# Patient Record
Sex: Female | Born: 1960 | Race: Black or African American | Hispanic: No | Marital: Single | Smoking: Never smoker
Health system: Southern US, Community
[De-identification: ages and names within clinical notes are randomized; demographics above are authoritative.]

## PROBLEM LIST (undated history)

## (undated) DIAGNOSIS — R42 Dizziness and giddiness: Secondary | ICD-10-CM

## (undated) DIAGNOSIS — D219 Benign neoplasm of connective and other soft tissue, unspecified: Secondary | ICD-10-CM

## (undated) HISTORY — DX: Benign neoplasm of connective and other soft tissue, unspecified: D21.9

## (undated) HISTORY — PX: ABDOMINAL HYSTERECTOMY: SHX81

## (undated) HISTORY — DX: Dizziness and giddiness: R42

## (undated) HISTORY — PX: CHOLECYSTECTOMY: SHX55

---
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
## 2004-03-28 ENCOUNTER — Ambulatory Visit: Payer: Self-pay

## 2004-04-26 ENCOUNTER — Ambulatory Visit: Payer: Self-pay | Admitting: Unknown Physician Specialty

## 2004-10-07 ENCOUNTER — Emergency Department: Payer: Self-pay | Admitting: Emergency Medicine

## 2005-02-27 ENCOUNTER — Ambulatory Visit (HOSPITAL_COMMUNITY): Admission: RE | Admit: 2005-02-27 | Discharge: 2005-02-27 | Payer: Self-pay | Admitting: Chiropractic Medicine

## 2006-02-26 ENCOUNTER — Ambulatory Visit: Payer: Self-pay

## 2006-03-11 HISTORY — PX: BREAST EXCISIONAL BIOPSY: SUR124

## 2006-04-17 ENCOUNTER — Ambulatory Visit: Payer: Self-pay | Admitting: Surgery

## 2006-11-26 ENCOUNTER — Ambulatory Visit: Payer: Self-pay

## 2008-06-29 ENCOUNTER — Ambulatory Visit: Payer: Self-pay

## 2008-10-18 ENCOUNTER — Emergency Department: Payer: Self-pay | Admitting: Emergency Medicine

## 2008-10-27 ENCOUNTER — Emergency Department: Payer: Self-pay | Admitting: Emergency Medicine

## 2009-06-16 ENCOUNTER — Emergency Department: Payer: Self-pay | Admitting: Unknown Physician Specialty

## 2009-07-30 ENCOUNTER — Emergency Department (HOSPITAL_COMMUNITY): Admission: EM | Admit: 2009-07-30 | Discharge: 2009-07-30 | Payer: Self-pay | Admitting: Emergency Medicine

## 2009-09-06 ENCOUNTER — Ambulatory Visit: Payer: Self-pay

## 2009-09-13 ENCOUNTER — Emergency Department: Payer: Self-pay | Admitting: Emergency Medicine

## 2010-01-12 ENCOUNTER — Emergency Department (HOSPITAL_COMMUNITY): Admission: EM | Admit: 2010-01-12 | Discharge: 2010-01-12 | Payer: Self-pay | Admitting: Emergency Medicine

## 2010-12-23 ENCOUNTER — Emergency Department: Payer: Self-pay | Admitting: Emergency Medicine

## 2011-01-15 ENCOUNTER — Emergency Department: Payer: Self-pay | Admitting: Unknown Physician Specialty

## 2011-02-26 ENCOUNTER — Ambulatory Visit: Payer: Self-pay

## 2011-08-15 ENCOUNTER — Emergency Department: Payer: Self-pay | Admitting: Emergency Medicine

## 2012-04-04 ENCOUNTER — Emergency Department: Payer: Self-pay | Admitting: Emergency Medicine

## 2012-04-04 LAB — CBC WITH DIFFERENTIAL/PLATELET
Basophil #: 0 10*3/uL (ref 0.0–0.1)
Basophil %: 0.8 %
Eosinophil #: 0.3 10*3/uL (ref 0.0–0.7)
HGB: 12.8 g/dL (ref 12.0–16.0)
Lymphocyte #: 2 10*3/uL (ref 1.0–3.6)
Lymphocyte %: 34.8 %
MCHC: 34.3 g/dL (ref 32.0–36.0)
Monocyte #: 0.7 x10 3/mm (ref 0.2–0.9)
Monocyte %: 11.5 %
Neutrophil %: 47.8 %
Platelet: 204 10*3/uL (ref 150–440)
RBC: 4.03 10*6/uL (ref 3.80–5.20)

## 2012-04-04 LAB — BASIC METABOLIC PANEL
Anion Gap: 7 (ref 7–16)
BUN: 10 mg/dL (ref 7–18)
Calcium, Total: 8.5 mg/dL (ref 8.5–10.1)
Co2: 26 mmol/L (ref 21–32)
Creatinine: 0.89 mg/dL (ref 0.60–1.30)
EGFR (Non-African Amer.): >60
Osmolality: 281 (ref 275–301)

## 2012-04-06 ENCOUNTER — Emergency Department: Payer: Self-pay | Admitting: Emergency Medicine

## 2012-04-10 LAB — CULTURE, BLOOD (SINGLE)

## 2012-12-09 ENCOUNTER — Ambulatory Visit: Payer: Self-pay

## 2013-10-20 ENCOUNTER — Ambulatory Visit: Payer: Self-pay

## 2013-10-27 ENCOUNTER — Ambulatory Visit: Payer: Self-pay

## 2013-12-22 ENCOUNTER — Ambulatory Visit: Payer: Self-pay

## 2014-01-10 ENCOUNTER — Encounter: Payer: Self-pay | Admitting: General Surgery

## 2014-01-10 ENCOUNTER — Ambulatory Visit (INDEPENDENT_AMBULATORY_CARE_PROVIDER_SITE_OTHER): Payer: PRIVATE HEALTH INSURANCE | Admitting: General Surgery

## 2014-01-10 VITALS — BP 102/60 | HR 71 | Resp 14 | Ht 60.0 in | Wt 129.0 lb

## 2014-01-10 DIAGNOSIS — N644 Mastodynia: Secondary | ICD-10-CM

## 2014-01-10 DIAGNOSIS — Z1239 Encounter for other screening for malignant neoplasm of breast: Secondary | ICD-10-CM

## 2014-01-10 NOTE — Patient Instructions (Signed)
Continue self breast exams. Call office for any new breast issues or concerns. 

## 2014-01-10 NOTE — Progress Notes (Signed)
Patient ID: Heather Ayers, female   DOB: [birth year], 53 y.o.   MRN: [id number]  Chief Complaint  Patient presents with  . Other    left breast lump    HPI Heather Ayers is a 53 y.o. female who presents for a breast evaluation. The most recent mammogram was done on 10/27/13. Patient does perform regular self breast checks and gets regular mammograms done. She reports discomfort in the left breast ever since her lumpectomy about 2-3 years ago. She reports this as coming and going. She reports first noticing a lump in her left breast about 1 year ago and it seems to be more difficult for her to find now. She does report discomfort in the area of the lump. Patient seen here in 2008.  Patient saw Dr. Pat Patrick as she did not want biopsy as office procedure.   HPI  No past medical history on file.  Past Surgical History  Procedure Laterality Date  . Cholecystectomy    . Breast lumpectomy Left 2008    Dr. Pat Patrick, nonproliferative breast tissue left UOQ.   . Breast lumpectomy Right 53 years old    No family history on file.  Social History History  Substance Use Topics  . Smoking status: Never Smoker   . Smokeless tobacco: Never Used  . Alcohol Use: 0.0 oz/week    0 Not specified per week    Allergies  Allergen Reactions  . Codeine Rash    No current outpatient prescriptions on file.   No current facility-administered medications for this visit.    Review of Systems Review of Systems  Constitutional: Negative.   Respiratory: Negative.   Cardiovascular: Negative.     Blood pressure 102/60, pulse 71, resp. rate 14, height 5' (1.524 m), weight 129 lb (58.514 kg).  Physical Exam Physical Exam  Constitutional: She is oriented to person, place, and time. She appears well-developed and well-nourished.  Eyes: Conjunctivae are normal. No scleral icterus.  Neck: Neck supple.  Cardiovascular: Normal rate and normal heart sounds.   Pulmonary/Chest: Effort normal and breath sounds  normal. Right breast exhibits inverted nipple. Right breast exhibits no mass, no nipple discharge, no skin change and no tenderness. Left breast exhibits inverted nipple. Left breast exhibits no mass, no nipple discharge, no skin change and no tenderness.  Well healed scar in left lower outer quadrant of left breast. Inverted nipples bilaterally. Well healed incision near areola at 10 o'clock right breast.  Lymphadenopathy:    She has no cervical adenopathy.  Neurological: She is alert and oriented to person, place, and time.    Data Reviewed August 2015 mammograms and ultrasound.  Assessment    Benign breast exam.  Modest mastalgia post biopsy.     Plan    Annual exams through the [facility] center.     Ref: Tanya Nones No PCP   Robert Bellow 01/11/2014, 9:06 PM

## 2014-01-11 ENCOUNTER — Encounter: Payer: Self-pay | Admitting: General Surgery

## 2014-01-11 DIAGNOSIS — N644 Mastodynia: Secondary | ICD-10-CM | POA: Insufficient documentation

## 2014-04-07 ENCOUNTER — Emergency Department: Payer: Self-pay | Admitting: Student

## 2014-05-20 ENCOUNTER — Emergency Department: Payer: Self-pay | Admitting: Emergency Medicine

## 2014-06-06 ENCOUNTER — Emergency Department (HOSPITAL_COMMUNITY): Payer: Self-pay

## 2014-06-06 ENCOUNTER — Emergency Department (HOSPITAL_COMMUNITY)
Admission: EM | Admit: 2014-06-06 | Discharge: 2014-06-06 | Disposition: A | Discharge status: Home / Self Care | Payer: Self-pay | Attending: Emergency Medicine | Admitting: Emergency Medicine

## 2014-06-06 ENCOUNTER — Encounter (HOSPITAL_COMMUNITY): Payer: Self-pay | Admitting: Neurology

## 2014-06-06 DIAGNOSIS — M545 Low back pain, unspecified: Secondary | ICD-10-CM

## 2014-06-06 DIAGNOSIS — Z9049 Acquired absence of other specified parts of digestive tract: Secondary | ICD-10-CM | POA: Insufficient documentation

## 2014-06-06 DIAGNOSIS — R112 Nausea with vomiting, unspecified: Secondary | ICD-10-CM | POA: Insufficient documentation

## 2014-06-06 DIAGNOSIS — R42 Dizziness and giddiness: Secondary | ICD-10-CM | POA: Insufficient documentation

## 2014-06-06 DIAGNOSIS — G44321 Chronic post-traumatic headache, intractable: Secondary | ICD-10-CM | POA: Insufficient documentation

## 2014-06-06 DIAGNOSIS — Z87828 Personal history of other (healed) physical injury and trauma: Secondary | ICD-10-CM | POA: Insufficient documentation

## 2014-06-06 LAB — COMPREHENSIVE METABOLIC PANEL
ALBUMIN: 4.3 g/dL (ref 3.5–5.2)
ALK PHOS: 42 U/L (ref 39–117)
ALT: 19 U/L (ref 0–35)
AST: 22 U/L (ref 0–37)
Anion gap: 9 (ref 5–15)
BILIRUBIN TOTAL: 0.8 mg/dL (ref 0.3–1.2)
BUN: 15 mg/dL (ref 6–23)
CHLORIDE: 103 mmol/L (ref 96–112)
CO2: 27 mmol/L (ref 19–32)
Calcium: 9.7 mg/dL (ref 8.4–10.5)
Creatinine, Ser: 0.95 mg/dL (ref 0.50–1.10)
GFR calc Af Amer: 78 mL/min — ABNORMAL LOW (ref >90)
GFR, EST NON AFRICAN AMERICAN: 68 mL/min — AB (ref >90)
Glucose, Bld: 92 mg/dL (ref 70–99)
POTASSIUM: 4.3 mmol/L (ref 3.5–5.1)
SODIUM: 139 mmol/L (ref 135–145)
Total Protein: 7.3 g/dL (ref 6.0–8.3)

## 2014-06-06 LAB — CBC WITH DIFFERENTIAL/PLATELET
BASOS ABS: 0.1 10*3/uL (ref 0.0–0.1)
BASOS PCT: 1 % (ref 0–1)
EOS ABS: 0.3 10*3/uL (ref 0.0–0.7)
EOS PCT: 7 % — AB (ref 0–5)
HCT: 41 % (ref 36.0–46.0)
HEMOGLOBIN: 13.7 g/dL (ref 12.0–15.0)
LYMPHS ABS: 2.5 10*3/uL (ref 0.7–4.0)
Lymphocytes Relative: 59 % — ABNORMAL HIGH (ref 12–46)
MCH: 30.6 pg (ref 26.0–34.0)
MCHC: 33.4 g/dL (ref 30.0–36.0)
MCV: 91.5 fL (ref 78.0–100.0)
MONO ABS: 0.2 10*3/uL (ref 0.1–1.0)
MONOS PCT: 5 % (ref 3–12)
NEUTROS PCT: 28 % — AB (ref 43–77)
Neutro Abs: 1.2 10*3/uL — ABNORMAL LOW (ref 1.7–7.7)
Platelets: 251 10*3/uL (ref 150–400)
RBC: 4.48 MIL/uL (ref 3.87–5.11)
RDW: 12.1 % (ref 11.5–15.5)
WBC: 4.2 10*3/uL (ref 4.0–10.5)

## 2014-06-06 MED ORDER — ONDANSETRON 4 MG PO TBDP: ORAL_TABLET | ORAL | Status: DC

## 2014-06-06 NOTE — ED Notes (Signed)
Pt reports in feb she slipped on some water. Since then has been having headaches, vomiting initially and feels nauseated. C/o left shoulder pain since then and lower back pain. Reports she went to [place] and was dx with vertigo. Also pain under butt when she is sleeping. Pt is a x 4. In NAD

## 2014-06-06 NOTE — ED Notes (Signed)
Pt walked with family to restroom. Did well with stand by assistance

## 2014-06-06 NOTE — ED Provider Notes (Addendum)
CSN: [id number]     Arrival date & time 06/06/14  1245 History   First MD Initiated Contact with Patient 06/06/14 1502     Chief Complaint  Patient presents with  . Fall     (Consider location/radiation/quality/duration/timing/severity/associated sxs/prior Treatment) Patient is a 54 y.o. female presenting with fall. The history is provided by the patient.  Fall Associated symptoms include headaches. Pertinent negatives include no chest pain, no abdominal pain and no shortness of breath.   patient with an injury on the job on February 1 slipped on a wet floor from a busted pipe. Falling backwards. Patient started with low back pain and 2 days later started with headache and vertigo. Patient followed at urgent care related to her workman's comp case and also at [facility] had head CT x-rays of her back which were all negative. Patient started on meclizine and when taking that does feel better has run out. Patient's lawyer is trying to make arrangements for her to be seen by neurology and did have an outpatient MRI. Patient has had the persistent vertigo though did not start immediately after the fall and may not be related to the injury at all. Patient does have persistent headache. Anywhere from 6-8 out of 10. Patient does not have any of lower extremity focal deficits. No numbness no weakness. Does have persistent bilateral low back pain. And as mentioned x-ray of the low back at [facility] was reported to be negative.  History reviewed. No pertinent past medical history. Past Surgical History  Procedure Laterality Date  . Cholecystectomy    . Breast lumpectomy Left 2008    Dr. Pat Patrick, nonproliferative breast tissue left UOQ.   . Breast lumpectomy Right 54 years old   No family history on file. History  Substance Use Topics  . Smoking status: Never Smoker   . Smokeless tobacco: Never Used  . Alcohol Use: 0.0 oz/week    0 Standard drinks or equivalent per week   OB History    Gravida Para  Term Preterm AB TAB SAB Ectopic Multiple Living   1         1      Obstetric Comments   1st Menstrual Cycle:  13 1st Pregnancy:  19     Review of Systems  Constitutional: Negative for fever.  HENT: Negative for congestion.   Eyes: Negative for visual disturbance.  Respiratory: Negative for shortness of breath.   Cardiovascular: Negative for chest pain.  Gastrointestinal: Positive for nausea and vomiting. Negative for abdominal pain.  Musculoskeletal: Positive for back pain.  Skin: Negative for rash.  Neurological: Positive for dizziness, light-headedness and headaches. Negative for weakness and numbness.  Hematological: Does not bruise/bleed easily.      Allergies  Codeine  Home Medications   Prior to Admission medications   Medication Sig Start Date End Date Taking? Authorizing Provider  butalbital-acetaminophen-caffeine (FIORICET, ESGIC) 50-325-40 MG per tablet Take 1 tablet by mouth every 4 (four) hours as needed for headache.   Yes Historical Provider, MD  ibuprofen (ADVIL,MOTRIN) 200 MG tablet Take 200 mg by mouth every 6 (six) hours as needed for moderate pain.   Yes Historical Provider, MD  meclizine (ANTIVERT) 25 MG tablet Take 25 mg by mouth 3 (three) times daily as needed for dizziness.   Yes Historical Provider, MD   BP 114/98 mmHg  Pulse 67  Temp(Src) 98.1 F (36.7 C) (Oral)  Resp 16  SpO2 100% Physical Exam  Constitutional: She is oriented to person, place,  and time. She appears well-developed and well-nourished. No distress.  HENT:  Head: Normocephalic and atraumatic.  Mouth/Throat: Oropharynx is clear and moist.  Eyes: Conjunctivae and EOM are normal. Pupils are equal, round, and reactive to light.  Neck: Normal range of motion.  Cardiovascular: Normal rate and normal heart sounds.   No murmur heard. Pulmonary/Chest: Effort normal and breath sounds normal.  Abdominal: Soft. Bowel sounds are normal. There is no tenderness.  Musculoskeletal: Normal  range of motion. She exhibits no edema or tenderness.  Except for some mild tenderness to the lower back bilaterally. No muscle spasm. Lower extremities without any focal deficit.  Neurological: She is alert and oriented to person, place, and time. No cranial nerve deficit. She exhibits normal muscle tone. Coordination normal.  Skin: Skin is warm. No rash noted.  Nursing note and vitals reviewed.   ED Course  Procedures (including critical care time) Labs Review Labs Reviewed  CBC WITH DIFFERENTIAL/PLATELET - Abnormal; Notable for the following:    Neutrophils Relative % 28 (*)    Neutro Abs 1.2 (*)    Lymphocytes Relative 59 (*)    Eosinophils Relative 7 (*)    All other components within normal limits  COMPREHENSIVE METABOLIC PANEL - Abnormal; Notable for the following:    GFR calc non Af Amer 68 (*)    GFR calc Af Amer 78 (*)    All other components within normal limits   Results for orders placed or performed during the hospital encounter of 06/06/14  CBC with Differential  Result Value Ref Range   WBC 4.2 4.0 - 10.5 K/uL   RBC 4.48 3.87 - 5.11 MIL/uL   Hemoglobin 13.7 12.0 - 15.0 g/dL   HCT 41.0 36.0 - 46.0 %   MCV 91.5 78.0 - 100.0 fL   MCH 30.6 26.0 - 34.0 pg   MCHC 33.4 30.0 - 36.0 g/dL   RDW 12.1 11.5 - 15.5 %   Platelets 251 150 - 400 K/uL   Neutrophils Relative % 28 (L) 43 - 77 %   Neutro Abs 1.2 (L) 1.7 - 7.7 K/uL   Lymphocytes Relative 59 (H) 12 - 46 %   Lymphs Abs 2.5 0.7 - 4.0 K/uL   Monocytes Relative 5 3 - 12 %   Monocytes Absolute 0.2 0.1 - 1.0 K/uL   Eosinophils Relative 7 (H) 0 - 5 %   Eosinophils Absolute 0.3 0.0 - 0.7 K/uL   Basophils Relative 1 0 - 1 %   Basophils Absolute 0.1 0.0 - 0.1 K/uL  Comprehensive metabolic panel  Result Value Ref Range   Sodium 139 135 - 145 mmol/L   Potassium 4.3 3.5 - 5.1 mmol/L   Chloride 103 96 - 112 mmol/L   CO2 27 19 - 32 mmol/L   Glucose, Bld 92 70 - 99 mg/dL   BUN 15 6 - 23 mg/dL   Creatinine, Ser 0.95 0.50  - 1.10 mg/dL   Calcium 9.7 8.4 - 10.5 mg/dL   Total Protein 7.3 6.0 - 8.3 g/dL   Albumin 4.3 3.5 - 5.2 g/dL   AST 22 0 - 37 U/L   ALT 19 0 - 35 U/L   Alkaline Phosphatase 42 39 - 117 U/L   Total Bilirubin 0.8 0.3 - 1.2 mg/dL   GFR calc non Af Amer 68 (L) >90 mL/min   GFR calc Af Amer 78 (L) >90 mL/min   Anion gap 9 5 - 15    Imaging Review No results found.  EKG Interpretation None      MDM   Final diagnoses:  Vertigo  Bilateral low back pain without sciatica  Intractable chronic post-traumatic headache    Patient currently involved in a Workmen's Comp. case. Patient with fall at work on February 1. Fell backwards slipped on a wet floor from a pipe burst. Patient with injury to her low back and has had persistent headaches and vertigo since. Symptoms didn't really start for about 2 days after the fall.  Patient is already had a head CT which was normal and x-rays of her back done at [facility] and by urgent care in the local area.  Patients lawyers trying to get her in to see a neurologist.  MRI of brain done here today for persistent vertigo to make sure there is no temporal lobe tumor. Otherwise MRI of the brain and low back could be done as an outpatient. Patient is now had vertigo for almost 2 months. Improve some with antivertigo but does not resolve. Patient also with persistent headache.  If MRI is negative. Patient can be discharged home with antivert.    Fredia Sorrow, MD 06/06/14 [place], MD 06/06/14 [phone]

## 2014-06-06 NOTE — ED Notes (Signed)
Pt went to MRI.

## 2014-06-06 NOTE — ED Notes (Signed)
MD at bedside. 

## 2014-06-06 NOTE — Discharge Instructions (Signed)

## 2014-07-01 ENCOUNTER — Ambulatory Visit
Admission: RE | Admit: 2014-07-01 | Discharge: 2014-07-01 | Disposition: A | Discharge status: Home / Self Care | Payer: PRIVATE HEALTH INSURANCE | Source: Ambulatory Visit | Attending: Neurosurgery | Admitting: Neurosurgery

## 2014-07-01 ENCOUNTER — Other Ambulatory Visit: Payer: Self-pay | Admitting: Neurosurgery

## 2014-07-01 DIAGNOSIS — M5412 Radiculopathy, cervical region: Secondary | ICD-10-CM

## 2015-04-20 ENCOUNTER — Encounter: Payer: Self-pay | Admitting: Emergency Medicine

## 2015-04-20 ENCOUNTER — Emergency Department: Payer: Self-pay

## 2015-04-20 ENCOUNTER — Emergency Department
Admission: EM | Admit: 2015-04-20 | Discharge: 2015-04-20 | Disposition: A | Discharge status: Home / Self Care | Payer: Self-pay | Attending: Emergency Medicine | Admitting: Emergency Medicine

## 2015-04-20 DIAGNOSIS — Y9389 Activity, other specified: Secondary | ICD-10-CM | POA: Insufficient documentation

## 2015-04-20 DIAGNOSIS — S3992XA Unspecified injury of lower back, initial encounter: Secondary | ICD-10-CM | POA: Insufficient documentation

## 2015-04-20 DIAGNOSIS — Y998 Other external cause status: Secondary | ICD-10-CM | POA: Insufficient documentation

## 2015-04-20 DIAGNOSIS — Y92512 Supermarket, store or market as the place of occurrence of the external cause: Secondary | ICD-10-CM | POA: Insufficient documentation

## 2015-04-20 DIAGNOSIS — S199XXA Unspecified injury of neck, initial encounter: Secondary | ICD-10-CM | POA: Insufficient documentation

## 2015-04-20 DIAGNOSIS — Z79899 Other long term (current) drug therapy: Secondary | ICD-10-CM | POA: Insufficient documentation

## 2015-04-20 DIAGNOSIS — W208XXA Other cause of strike by thrown, projected or falling object, initial encounter: Secondary | ICD-10-CM | POA: Insufficient documentation

## 2015-04-20 DIAGNOSIS — S0083XA Contusion of other part of head, initial encounter: Secondary | ICD-10-CM | POA: Insufficient documentation

## 2015-04-20 MED ORDER — ONDANSETRON 8 MG PO TBDP
8.0000 mg | ORAL_TABLET | Freq: Once | ORAL | Status: AC
  Administered 2015-04-20: 8 mg via ORAL
  Filled 2015-04-20: qty 1

## 2015-04-20 MED ORDER — NAPROXEN 500 MG PO TABS
500.0000 mg | ORAL_TABLET | Freq: Once | ORAL | Status: AC
  Administered 2015-04-20: 500 mg via ORAL
  Filled 2015-04-20: qty 1

## 2015-04-20 MED ORDER — NAPROXEN 500 MG PO TABS: 500.0000 mg | ORAL_TABLET | Freq: Two times a day (BID) | ORAL | Status: DC

## 2015-04-20 NOTE — ED Provider Notes (Signed)
[facility] Emergency Department Provider Note  ____________________________________________  Time seen: Approximately 6:52 PM  I have reviewed the triage vital signs and the nursing notes.   HISTORY  Chief Complaint Eye Pain and Neck Pain    HPI Heather Ayers is a 55 y.o. female who presents emergency department complaining of left face and eye pain, headache, nausea, and pain radiating down her neck, back, and legs. Patient states that she was at a restaurant getting a "hot dog" when she reached for a napkin for the dispenser. She states that the dispenser fell off the wall striking her in the left side of her face. 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 Patient did not lose consciousness at any time. Patient denies any numbness or tingling to the legs or arms. She denies any chest pain, shortness of breath, or vomiting.   History reviewed. No pertinent past medical history.  Patient Active Problem List   Diagnosis Date Noted  . Mastalgia 01/11/2014    Past Surgical History  Procedure Laterality Date  . Cholecystectomy    . Breast lumpectomy Left 2008    Dr. Pat Patrick, nonproliferative breast tissue left UOQ.   . Breast lumpectomy Right 55 years old    Current Outpatient Rx  Name  Route  Sig  Dispense  Refill  . butalbital-acetaminophen-caffeine (FIORICET, ESGIC) 50-325-40 MG per tablet   Oral   Take 1 tablet by mouth every 4 (four) hours as needed for headache.         . ibuprofen (ADVIL,MOTRIN) 200 MG tablet   Oral   Take 200 mg by mouth every 6 (six) hours as needed for moderate pain.         . meclizine (ANTIVERT) 25 MG tablet   Oral   Take 25 mg by mouth 3 (three) times daily as needed for dizziness.         . naproxen (NAPROSYN) 500 MG tablet   Oral   Take 1 tablet (500 mg total) by mouth 2 (two) times daily with a meal.   60 tablet   0   . ondansetron  (ZOFRAN ODT) 4 MG disintegrating tablet      4mg  ODT q4 hours prn nausea/vomit   10 tablet   0     Allergies Codeine  No family history on file.  Social History Social History  Substance Use Topics  . Smoking status: Never Smoker   . Smokeless tobacco: Never Used  . Alcohol Use: 0.0 oz/week    0 Standard drinks or equivalent per week     Review of Systems  Constitutional: No fever/chills Eyes: No visual changes. No discharge Cardiovascular: no chest pain. Respiratory: no cough. No SOB. Gastrointestinal: No abdominal pain.  Positive for nausea but no vomiting.   Musculoskeletal: Positive for neck and back pain. Skin: Negative for rash. Neurological: Positive for headache but denies focal weakness or numbness. 10-point ROS otherwise negative.  ____________________________________________   PHYSICAL EXAM:  VITAL SIGNS: ED Triage Vitals  Enc Vitals Group     BP 04/20/15 1818 146/99 mmHg     Pulse Rate 04/20/15 1818 59     Resp 04/20/15 1818 16     Temp 04/20/15 1818 98.1 F (36.7 C)     Temp Source 04/20/15 1818 Oral     SpO2 04/20/15 1818 100 %     Weight 04/20/15 1818 135 lb (61.236 kg)  Height 04/20/15 1818 5' (1.524 m)     Head Cir --      Peak Flow --      Pain Score 04/20/15 1818 10     Pain Loc --      Pain Edu? --      Excl. in [place]? --      Constitutional: Alert and oriented. Well appearing and in no acute distress. Eyes: Conjunctivae are normal. PERRL. EOMI. funduscopic exam is unremarkable bilaterally. Radial reflexes appreciated. No hyphema is noted. Head: Atraumatic. No ecchymosis, contusion, abrasions are noted. There is no palpable abnormality to the bony structures of the face or head. No crepitus noted. Patient reports diffuse tenderness to palpation around the left orbit. Neck: No stridor.  No cervical spine tenderness to palpation. Hematological/Lymphatic/Immunilogical: No cervical lymphadenopathy. Cardiovascular: Normal rate, regular  rhythm. Normal S1 and S2.  Good peripheral circulation. Respiratory: Normal respiratory effort without tachypnea or retractions. Lungs CTAB. Musculoskeletal: No lower extremity tenderness nor edema.  No joint effusions. No visible deformity to spine upon inspection. No tenderness to palpation. No palpable abnormality. Negative straight leg raise bilaterally. Radial pulses appreciated bilaterally. Dorsalis pedis pulse operation bilaterally. Sensation intact and equal all 4 extremities. DTRs intact. Neurologic:  Normal speech and language. No gross focal neurologic deficits are appreciated. Cranial nerves II through XII are grossly intact. Skin:  Skin is warm, dry and intact. No rash noted. Psychiatric: Mood and affect are normal. Speech and behavior are normal. Patient exhibits appropriate insight and judgement.   ____________________________________________   LABS (all labs ordered are listed, but only abnormal results are displayed)  Labs Reviewed - No data to display ____________________________________________  EKG   ____________________________________________  RADIOLOGY Diamantina Providence Cuthriell, personally viewed and evaluated these images as part of my medical decision making, as well as reviewing the written report by the radiologist.  Ct Maxillofacial Wo Cm  04/20/2015  CLINICAL DATA:  55 year old female with history and left orbital pain. EXAM: CT MAXILLOFACIAL WITHOUT CONTRAST TECHNIQUE: Multidetector CT imaging of the maxillofacial structures was performed. Multiplanar CT image reconstructions were also generated. A small metallic BB was placed on the right temple in order to reliably differentiate right from left. COMPARISON:  Head CT dated 05/20/2014 FINDINGS: There is no acute fracture. The maxilla, mandible, and pterygoid plates are intact. The globes, retro-orbital fat, and orbital walls are preserved. There is diffuse mucoperiosteal thickening of paranasal sinuses with partial  opacification of the ethmoid air cells and maxillary sinuses. No air-fluid level. The visualized mastoid air cells are clear. No significant soft tissue swelling. IMPRESSION: No acute facial fractures. Electronically Signed   By: Anner Crete M.D.   On: 04/20/2015 19:10    ____________________________________________    PROCEDURES  Procedure(s) performed:       Medications  naproxen (NAPROSYN) tablet 500 mg (500 mg Oral Given 04/20/15 1908)  ondansetron (ZOFRAN-ODT) disintegrating tablet 8 mg (8 mg Oral Given 04/20/15 1908)     ____________________________________________   INITIAL IMPRESSION / ASSESSMENT AND PLAN / ED COURSE  Pertinent labs & imaging results that were available during my care of the patient were reviewed by me and considered in my medical decision making (see chart for details).  Patient's diagnosis is consistent with facial contusion. CT scan was ordered and reveals no acute abnormality. Patient has no neuro deficits or other focal abnormality. Headache responded well to Naprosyn.. Patient will be discharged home with prescriptions for anti-inflammatories. Patient is to follow up with primary care provider if  symptoms persist past this treatment course. Patient is given ED precautions to return to the ED for any worsening or new symptoms.     ____________________________________________  FINAL CLINICAL IMPRESSION(S) / ED DIAGNOSES  Final diagnoses:  Facial contusion, initial encounter      NEW MEDICATIONS STARTED DURING THIS VISIT:  New Prescriptions   NAPROXEN (NAPROSYN) 500 MG TABLET    Take 1 tablet (500 mg total) by mouth 2 (two) times daily with a meal.        Darletta Moll, PA-C 04/20/15 [place], MD 04/21/15 [phone]

## 2015-04-20 NOTE — ED Notes (Addendum)
Pt reports got hit in the forehead with a paper towel dispenser at a store; reports pain behind left eye and pain shooting down back. Pt with hx of bulging disc. Pt reports headache and nausea.

## 2015-04-20 NOTE — Discharge Instructions (Signed)

## 2015-04-24 ENCOUNTER — Ambulatory Visit: Payer: Self-pay | Attending: Oncology | Admitting: *Deleted

## 2015-04-24 ENCOUNTER — Encounter: Payer: Self-pay | Admitting: *Deleted

## 2015-04-24 VITALS — BP 120/84 | HR 60 | Temp 95.7°F | Resp 18 | Ht 63.39 in | Wt 146.5 lb

## 2015-04-24 DIAGNOSIS — N63 Unspecified lump in unspecified breast: Secondary | ICD-10-CM

## 2015-04-24 NOTE — Progress Notes (Signed)
Subjective:     Patient ID: Heather Ayers, female   DOB: 05-21-1960, 55 y.o.   MRN: [id number]  HPI   Review of Systems     Objective:   Physical Exam  Pulmonary/Chest: Right breast exhibits inverted nipple. Right breast exhibits no mass, no nipple discharge, no skin change and no tenderness. Left breast exhibits inverted nipple. Left breast exhibits no mass, no nipple discharge, no skin change and no tenderness. Breasts are symmetrical.    Bilateral nipples are inverted       Assessment:     55 year old Black female returns to [facility] for annual screening.  On clinical breast exam I can palpate a tender 2 cm smooth, mobile nodule at approximately 2:30 left breast.  This is the same area of concern that was noted in August 2015.  Diagnostic mammogram and ultrasound at that time was a birads 2 with glandular tissue at the site of concern. Patient also saw Dr. Bary Castilla and yearly screening was recommended. Patient has one sister deceased from breast cancer.  Taught self breast awareness.  Patient has been screened for eligibility.  She does not have any insurance, Medicare or Medicaid.  She also meets financial eligibility.  Hand-out given on the Affordable Care Act.    Plan:     Will go ahead and get bilateral diagnostic mammogram and ultrasound since it has been about a year and a half since her last exam, and her family history of breast cancer.  Will follow-up per [facility] protocol.

## 2015-04-24 NOTE — Patient Instructions (Signed)
Gave patient hand-out, Women Staying Healthy, Active and Well from [facility], with education on breast health, pap smears, heart and colon health. 

## 2015-05-05 ENCOUNTER — Ambulatory Visit
Admission: RE | Admit: 2015-05-05 | Discharge: 2015-05-05 | Disposition: A | Discharge status: Home / Self Care | Payer: Self-pay | Source: Ambulatory Visit | Attending: Oncology | Admitting: Oncology

## 2015-05-05 ENCOUNTER — Encounter: Payer: Self-pay | Admitting: *Deleted

## 2015-05-05 DIAGNOSIS — N63 Unspecified lump in unspecified breast: Secondary | ICD-10-CM

## 2015-05-05 NOTE — Progress Notes (Signed)
Letter mailed from [facility] to inform patient of her normal mammogram results.  Patient is to follow-up with annual screening in one year.  HSIS to Christy. 

## 2016-10-02 ENCOUNTER — Encounter: Payer: Self-pay | Admitting: *Deleted

## 2016-10-02 ENCOUNTER — Ambulatory Visit: Payer: Self-pay | Attending: Oncology | Admitting: *Deleted

## 2016-10-02 ENCOUNTER — Ambulatory Visit
Admission: RE | Admit: 2016-10-02 | Discharge: 2016-10-02 | Disposition: A | Discharge status: Home / Self Care | Payer: Self-pay | Source: Ambulatory Visit | Attending: Oncology | Admitting: Oncology

## 2016-10-02 VITALS — BP 117/79 | HR 58 | Temp 98.0°F | Ht 60.0 in | Wt 130.0 lb

## 2016-10-02 DIAGNOSIS — Z0000 Encounter for general adult medical examination without abnormal findings: Secondary | ICD-10-CM

## 2016-10-02 NOTE — Progress Notes (Signed)
Subjective:     Patient ID: Heather Ayers, female   DOB: 1960/09/23, 56 y.o.   MRN: [id number]  HPI   Review of Systems     Objective:   Physical Exam  Pulmonary/Chest: Right breast exhibits inverted nipple. Right breast exhibits no mass, no nipple discharge, no skin change and no tenderness. Left breast exhibits inverted nipple. Left breast exhibits no mass, no nipple discharge, no skin change and no tenderness. Breasts are symmetrical.  Bilateral inverted nipples - patient states this is normal for her.       Assessment:     56 year old Black female returns to [facility] for annual exam.  Clinical breast exam unremarkable.  Taught self breast awareness.  Patient had a supracervical hysterectomy in 2006 for fibroids.  Last pap was in 2012.  Patient refused pap smear today.  Explained guidelines for pap smear with a supracervical hysterectomy.  Encouraged patient to follow pap smear quidelines.  States she will return a different day for her pap.  Patient has been screened for eligibility.  She does not have any insurance, Medicare or Medicaid.  She also meets financial eligibility.  Hand-out given on the Affordable Care Act.    Plan:     Screening mammogram ordered.  Will follow-up per [facility] protocol.  Jeanella Anton to schedule patient to return for her pap smear.

## 2016-10-02 NOTE — Patient Instructions (Signed)
Gave patient hand-out, Women Staying Healthy, Active and Well from [facility], with education on breast health, pap smears, heart and colon health. 

## 2016-10-03 ENCOUNTER — Encounter: Payer: Self-pay | Admitting: *Deleted

## 2016-10-03 NOTE — Progress Notes (Signed)
Letter mailed from [facility] to inform patient of her normal mammogram results.  Patient is to follow-up with annual screening in one year.  HSIS to Christy. 

## 2016-10-30 ENCOUNTER — Ambulatory Visit: Payer: Self-pay | Attending: Oncology | Admitting: *Deleted

## 2016-10-30 ENCOUNTER — Encounter: Payer: Self-pay | Admitting: *Deleted

## 2016-10-30 VITALS — BP 109/73 | HR 53 | Temp 98.3°F | Resp 18

## 2016-10-30 DIAGNOSIS — Z0000 Encounter for general adult medical examination without abnormal findings: Secondary | ICD-10-CM

## 2016-10-30 NOTE — Progress Notes (Signed)
Subjective:     Patient ID: Heather Ayers, female   DOB: 09-14-1960, 56 y.o.   MRN: [id number]  HPI   Review of Systems     Objective:   Physical Exam  Genitourinary: No labial fusion. There is no rash, tenderness, lesion or injury on the right labia. There is no rash, tenderness, lesion or injury on the left labia. Cervix exhibits no motion tenderness, no discharge and no friability. Right adnexum displays no mass, no tenderness and no fullness. Left adnexum displays no mass, no tenderness and no fullness. No erythema, tenderness or bleeding in the vagina. No foreign body in the vagina. No signs of injury around the vagina. No vaginal discharge found.  Genitourinary Comments: Supracervical hysterectomy       Assessment:     Patient returns today for pap smear only.  She has a history of supracervical hysterectomy.  Specimen collected for pap smear without difficulty.  Patient has been screened for eligibility.  She does not have any insurance, Medicare or Medicaid.  She also meets financial eligibility.  Hand-out given on the Affordable Care Act.    Plan:     Specimen sent to the lab.  Will follow-up per [facility] proocol.

## 2016-10-30 NOTE — Patient Instructions (Signed)
  HPV Test The human papillomavirus (HPV) test is used to look for high-risk types of HPV infection. HPV is a group of about 100 viruses. Many of these viruses cause growths on, in, or around the genitals. Most HPV viruses cause infections that usually go away without treatment. However, HPV types 6, 11, 16, and 18 are considered high-risk types of HPV that can increase your risk of cancer of the cervix or anus if the infection is left untreated. An HPV test identifies the DNA (genetic) strands of the HPV infection, so it is also referred to as the HPV DNA test. Although HPV is found in both males and females, the HPV test is only used to screen for increased cancer risk in females:  With an abnormal Pap test.  After treatment of an abnormal Pap test.  Between the ages of 57 and 57.  After treatment of a high-risk HPV infection.  The HPV test may be done at the same time as a pelvic exam and Pap test in females over the age of 77. Both the HPV test and Pap test require a sample of cells from the cervix. How do I prepare for this test?  Do not douche or take a bath for 24-48 hours before the test or as directed by your health care provider.  Do not have sex for 24-48 hours before the test or as directed by your health care provider.  You may be asked to reschedule the test if you are menstruating.  You will be asked to urinate before the test. What do the results mean? It is your responsibility to obtain your test results. Ask the lab or department performing the test when and how you will get your results. Talk with your health care provider if you have any questions about your results. Your result will be negative or positive. Meaning of Negative Test Results A negative HPV test result means that no HPV was found, and it is very likely that you do not have HPV. Meaning of Positive Test Results A positive HPV test result indicates that you have HPV.  If your test result shows the  presence of any high-risk HPV strains, you may have an increased risk of developing cancer of the cervix or anus if the infection is left untreated.  If any low-risk HPV strains are found, you are not likely to have an increased risk of cancer.  Discuss your test results with your health care provider. He or she will use the results to make a diagnosis and determine a treatment plan that is right for you. Talk with your health care provider to discuss your results, treatment options, and if necessary, the need for more tests. Talk with your health care provider if you have any questions about your results. This information is not intended to replace advice given to you by your health care provider. Make sure you discuss any questions you have with your health care provider. Document Released: 03/22/2004 Document Revised: 11/01/2015 Document Reviewed: 07/13/2013 Elsevier Interactive Patient Education  2017 [employer].

## 2016-11-02 LAB — PAP LB AND HPV HIGH-RISK
HPV, high-risk: NEGATIVE
PAP Smear Comment: 0

## 2016-11-05 ENCOUNTER — Encounter: Payer: Self-pay | Admitting: *Deleted

## 2016-11-05 NOTE — Progress Notes (Signed)
Letter mailed to inform patient of her normal, HPV negative pap smear.  Next pap due in 5 years.  [facility] to [place].

## 2017-04-02 ENCOUNTER — Ambulatory Visit: Payer: Self-pay | Attending: Oncology | Admitting: *Deleted

## 2017-04-02 VITALS — BP 118/83 | HR 69 | Temp 98.1°F | Ht 63.0 in | Wt 139.0 lb

## 2017-04-02 DIAGNOSIS — N889 Noninflammatory disorder of cervix uteri, unspecified: Secondary | ICD-10-CM

## 2017-04-02 NOTE — Progress Notes (Signed)
Subjective:     Patient ID: Heather Ayers, female   DOB: Apr 19, 1960, 57 y.o.   MRN: [id number]  HPI   Review of Systems     Objective:   Physical Exam  Genitourinary: No labial fusion. There is no rash, tenderness, lesion or injury on the right labia. There is no rash, tenderness, lesion or injury on the left labia. There is bleeding in the vagina. No erythema or tenderness in the vagina. No foreign body in the vagina. No signs of injury around the vagina. No vaginal discharge found.         Assessment:     57 year old Black female called to see if she could return to our clinic for further evaluation of a new onset of vaginal bleeding.  She is status post supracervical hysterectomy in 2006 for fibroids.  Patient states she noticed red blood in her panties and on her tissue paper starting Saturday or Sunday of this week.  States it was bright red, then would get lighter.  States she has noticed it off and on since then.  Last pap completed through our [facility] program on 10/30/16 was negative / negative.  There was normal appearing cervix at that time. On clinical exam today, there is noted a red clot like area at 8:00 on the cervix.  There is also some blood noted in the vagina.  I used a cotton tipped swap to try and wipe away the clot, with notable bleeding.  There is a clear like lesion under the clot that looks like it is bleeding.  Patient denies intercourse, douching or anything inserted into the vagina.  State she has not had intercourse in over a year.  Denies any trauma to the area.  Family history of cancer includes one sister diagnosed with breast cancer.    Plan:     Will refer patient to gynecology for further evaluation.  She has been scheduled to see Dr. Amalia Hailey at [facility] on 04/07/17 @ 9:45.  Will follow-up per [facility] protocol.

## 2017-04-03 ENCOUNTER — Encounter: Payer: Self-pay | Admitting: *Deleted

## 2017-04-07 ENCOUNTER — Encounter: Payer: Self-pay | Admitting: Obstetrics and Gynecology

## 2017-04-07 ENCOUNTER — Ambulatory Visit (INDEPENDENT_AMBULATORY_CARE_PROVIDER_SITE_OTHER): Payer: PRIVATE HEALTH INSURANCE | Admitting: Obstetrics and Gynecology

## 2017-04-07 VITALS — BP 113/71 | HR 66 | Ht 60.0 in | Wt 142.0 lb

## 2017-04-07 DIAGNOSIS — N889 Noninflammatory disorder of cervix uteri, unspecified: Secondary | ICD-10-CM

## 2017-04-07 NOTE — Progress Notes (Signed)
HPI:      [name] is a 57 y.o. G1P1001 who LMP was No LMP recorded. Patient has had a hysterectomy.  Subjective:   She presents today from [facility].  Patient states that she has had some spotting vaginally over the last few weeks.  Her exam showed "a spot on her cervix that needed further evaluation." She reports that she has never had a bad Pap smear.  She reports her most recent one within approximately 1 year.    Hx: The following portions of the patient's history were reviewed and updated as appropriate:             She  has a past medical history of Fibroid. She does not have any pertinent problems on file. She  has a past surgical history that includes Cholecystectomy; Breast excisional biopsy (Left, 2008); Breast excisional biopsy (Right, 1980); and Abdominal hysterectomy. Her family history includes Breast cancer (age of onset: 62) in her sister; Diabetes in her brother and sister. She  reports that  has never smoked. she has never used smokeless tobacco. She reports that she drinks alcohol. She reports that she does not use drugs. She is allergic to codeine.       Review of Systems:  Review of Systems  Constitutional: Denied constitutional symptoms, night sweats, recent illness, fatigue, fever, insomnia and weight loss.  Eyes: Denied eye symptoms, eye pain, photophobia, vision change and visual disturbance.  Ears/Nose/Throat/Neck: Denied ear, nose, throat or neck symptoms, hearing loss, nasal discharge, sinus congestion and sore throat.  Cardiovascular: Denied cardiovascular symptoms, arrhythmia, chest pain/pressure, edema, exercise intolerance, orthopnea and palpitations.  Respiratory: Denied pulmonary symptoms, asthma, pleuritic pain, productive sputum, cough, dyspnea and wheezing.  Gastrointestinal: Denied, gastro-esophageal reflux, melena, nausea and vomiting.  Genitourinary: See HPI for additional information.  Musculoskeletal: Denied musculoskeletal symptoms,  stiffness, swelling, muscle weakness and myalgia.  Dermatologic: Denied dermatology symptoms, rash and scar.  Neurologic: Denied neurology symptoms, dizziness, headache, neck pain and syncope.  Psychiatric: Denied psychiatric symptoms, anxiety and depression.  Endocrine: Denied endocrine symptoms including hot flashes and night sweats.   Meds:   No current outpatient medications on file prior to visit.   No current facility-administered medications on file prior to visit.     Objective:     Vitals:   04/07/17 0928  BP: 113/71  Pulse: 66              Physical examination   Pelvic:   Vulva: Normal appearance.  No lesions.  Vagina: No lesions or abnormalities noted.  Support: Normal pelvic support.  Urethra No masses tenderness or scarring.  Meatus Normal size without lesions or prolapse.  Cervix:  Small friable erythematous lesion at 7:00 approximately 4 mm in diameter.  Mild to moderate cervical stenosis noted.  Anus: Normal exam.  No lesions.  Perineum: Normal exam.  No lesions.        Bimanual   Uterus:  Surgically absent  Adnexae: No masses.  Non-tender to palpation.  Cul-de-sac: Negative for abnormality.   Cervical biopsy performed at 7:00.  Monsel solution applied hemostasis noted.  Assessment:    G1P1001 Patient Active Problem List   Diagnosis Date Noted  . Mastalgia 01/11/2014     1. Cervical lesion        Plan:            1.  Await biopsy results and discuss management at that time. Orders No orders of the defined types were placed in this  encounter.   No orders of the defined types were placed in this encounter.     F/U  Return in about 1 week (around 04/14/2017).  Finis Bud, M.D. 04/07/2017 3:41 PM

## 2017-04-08 ENCOUNTER — Telehealth: Payer: Self-pay | Admitting: *Deleted

## 2017-04-08 NOTE — Telephone Encounter (Signed)
Patient called me today to update me on her visit with Dr. Amalia Hailey yesterday.  States he did a cervical biopsy and has recommended she have her cervix removed "because her bladder drops".  Discussed that we should wait on the final pathology and his recommendations at that time.  If normal path, I can have her complete patient financial forms if he does indeed recommend surgery.

## 2017-04-09 LAB — PATHOLOGY

## 2017-04-15 ENCOUNTER — Encounter: Payer: Self-pay | Admitting: Obstetrics and Gynecology

## 2017-04-15 ENCOUNTER — Ambulatory Visit (INDEPENDENT_AMBULATORY_CARE_PROVIDER_SITE_OTHER): Payer: PRIVATE HEALTH INSURANCE | Admitting: Obstetrics and Gynecology

## 2017-04-15 VITALS — BP 113/78 | HR 65 | Ht 60.0 in | Wt 140.6 lb

## 2017-04-15 DIAGNOSIS — N889 Noninflammatory disorder of cervix uteri, unspecified: Secondary | ICD-10-CM

## 2017-04-15 NOTE — Progress Notes (Signed)
HPI:      [name] is a 57 y.o. G1P1001 who LMP was No LMP recorded. Patient has had a hysterectomy.  Subjective:   She presents today for follow-up of her cervical biopsy.    Hx: The following portions of the patient's history were reviewed and updated as appropriate:             She  has a past medical history of Fibroid. She does not have any pertinent problems on file. She  has a past surgical history that includes Cholecystectomy; Breast excisional biopsy (Left, 2008); Breast excisional biopsy (Right, 1980); and Abdominal hysterectomy. Her family history includes Breast cancer (age of onset: 24) in her sister; Diabetes in her brother and sister. She  reports that  has never smoked. she has never used smokeless tobacco. She reports that she drinks alcohol. She reports that she does not use drugs. She is allergic to codeine.       Review of Systems:  Review of Systems  Constitutional: Denied constitutional symptoms, night sweats, recent illness, fatigue, fever, insomnia and weight loss.  Eyes: Denied eye symptoms, eye pain, photophobia, vision change and visual disturbance.  Ears/Nose/Throat/Neck: Denied ear, nose, throat or neck symptoms, hearing loss, nasal discharge, sinus congestion and sore throat.  Cardiovascular: Denied cardiovascular symptoms, arrhythmia, chest pain/pressure, edema, exercise intolerance, orthopnea and palpitations.  Respiratory: Denied pulmonary symptoms, asthma, pleuritic pain, productive sputum, cough, dyspnea and wheezing.  Gastrointestinal: Denied, gastro-esophageal reflux, melena, nausea and vomiting.  Genitourinary: Denied genitourinary symptoms including symptomatic vaginal discharge, pelvic relaxation issues, and urinary complaints.  Musculoskeletal: Denied musculoskeletal symptoms, stiffness, swelling, muscle weakness and myalgia.  Dermatologic: Denied dermatology symptoms, rash and scar.  Neurologic: Denied neurology symptoms, dizziness,  headache, neck pain and syncope.  Psychiatric: Denied psychiatric symptoms, anxiety and depression.  Endocrine: Denied endocrine symptoms including hot flashes and night sweats.   Meds:   No current outpatient medications on file prior to visit.   No current facility-administered medications on file prior to visit.     Objective:     Vitals:   04/15/17 0926  BP: 113/78  Pulse: 65              Cervical biopsy results reviewed directly with the patient.  Assessment:    G1P1001 Patient Active Problem List   Diagnosis Date Noted  . Mastalgia 01/11/2014     1. Cervical lesion     Consistent with granulation tissue.  This is a benign condition, in fact I believe the biopsy probably greatly increase the likelihood of resolution.   Plan:            1.  Nothing further to do.  Expect resolution of lesion.  2.  Patient may return to routine care at [facility] with routine Pap schedule. Orders No orders of the defined types were placed in this encounter.   No orders of the defined types were placed in this encounter.     F/U  Return for Annual Physical. [facility] I spent 15 minutes with this patient of which greater than 50% was spent discussing cervical granulation tissue, findings of biopsy, future management, future routine care and health maintenance.  Finis Bud, M.D. 04/15/2017 9:41 AM

## 2017-04-24 ENCOUNTER — Encounter: Payer: Self-pay | Admitting: *Deleted

## 2017-04-24 NOTE — Progress Notes (Signed)
Per Dr. Rebekah [place] note, patient is to return to routine screening.  HSIS to [place].

## 2017-09-22 ENCOUNTER — Ambulatory Visit
Admission: RE | Admit: 2017-09-22 | Discharge: 2017-09-22 | Disposition: A | Discharge status: Home / Self Care | Payer: Self-pay | Source: Ambulatory Visit | Attending: Oncology | Admitting: Oncology

## 2017-09-22 ENCOUNTER — Encounter: Payer: Self-pay | Admitting: *Deleted

## 2017-09-22 ENCOUNTER — Ambulatory Visit: Payer: Self-pay | Attending: Oncology | Admitting: *Deleted

## 2017-09-22 VITALS — BP 115/78 | HR 64 | Temp 98.2°F | Ht 63.0 in | Wt 134.0 lb

## 2017-09-22 DIAGNOSIS — N644 Mastodynia: Secondary | ICD-10-CM

## 2017-09-22 NOTE — Progress Notes (Signed)
  Subjective:     Patient ID: Heather Ayers, female   DOB: [birth year], 57 y.o.   MRN: [id number]  HPI   Review of Systems     Objective:   Physical Exam  Pulmonary/Chest: Right breast exhibits inverted nipple. Right breast exhibits no mass, no nipple discharge, no skin change and no tenderness. Left breast exhibits inverted nipple, mass and tenderness. Left breast exhibits no nipple discharge and no skin change.         Assessment:     57 year old Black female returns to [facility] for annual screening.  States she has been having left breast pain and swelling for a couple of months.  States the pain is intermittent, and she thinks it may be worse when does "more sewing or lifting".  On clinical breast exam there is a small mobile soft nodule at 1:00 left breast, that has been present for several years and has been evaluated and determined to be benign.  There is no dominant mass, skin changes, nipple discharge or lymphadenopathy.  There is no obvious edema noted.  Patient with family history of her sister with breast cancer in her 18's.   Risk Assessment    Risk Scores      09/22/2017   Last edited by: Rico Junker, RN   5-year risk: 2.9 %   Lifetime risk: 15.8 %        Taught self breast awareness.  Last pap on 10/30/16 was negative/ negative.  Nex pap due in 2023.  Patient has been screened for eligibility.  She does not have any insurance, Medicare or Medicaid.  She also meets financial eligibility.  Hand-out given on the Affordable Care Act.    Plan:     Will get bilateral diagnostic mammogram and ultrasound for targeted pain.  If no findings on imaging, patient is to continue self breast awareness and report any new findings.  She is agreeable to the plan.

## 2017-09-22 NOTE — Patient Instructions (Signed)
Gave patient hand-out, Women Staying Healthy, Active and Well from [facility], with education on breast health, pap smears, heart and colon health. 

## 2017-09-23 ENCOUNTER — Encounter: Payer: Self-pay | Admitting: *Deleted

## 2017-09-23 NOTE — Progress Notes (Signed)
Patient with benign findings on imaging.  She is to report any new breast findings and we will get her back in.  Otherwise next screening mammogram in 1 year.  HSIS to [place].

## 2017-09-29 ENCOUNTER — Ambulatory Visit: Payer: Self-pay

## 2018-09-10 ENCOUNTER — Other Ambulatory Visit: Payer: Self-pay

## 2018-09-10 ENCOUNTER — Emergency Department
Admission: EM | Admit: 2018-09-10 | Discharge: 2018-09-11 | Disposition: A | Discharge status: Home / Self Care | Payer: No Typology Code available for payment source | Attending: Emergency Medicine | Admitting: Emergency Medicine

## 2018-09-10 ENCOUNTER — Emergency Department: Payer: No Typology Code available for payment source

## 2018-09-10 ENCOUNTER — Encounter: Payer: Self-pay | Admitting: *Deleted

## 2018-09-10 DIAGNOSIS — G8929 Other chronic pain: Secondary | ICD-10-CM | POA: Diagnosis not present

## 2018-09-10 DIAGNOSIS — M549 Dorsalgia, unspecified: Secondary | ICD-10-CM | POA: Diagnosis not present

## 2018-09-10 DIAGNOSIS — M542 Cervicalgia: Secondary | ICD-10-CM | POA: Diagnosis not present

## 2018-09-10 MED ORDER — ACETAMINOPHEN 325 MG PO TABS
650.0000 mg | ORAL_TABLET | Freq: Once | ORAL | Status: AC
  Administered 2018-09-10: 23:00:00 650 mg via ORAL
  Filled 2018-09-10: qty 2

## 2018-09-10 MED ORDER — METHOCARBAMOL 500 MG PO TABS
1000.0000 mg | ORAL_TABLET | Freq: Once | ORAL | Status: AC
  Administered 2018-09-10: 1000 mg via ORAL
  Filled 2018-09-10: qty 2

## 2018-09-10 NOTE — ED Notes (Signed)
Pt away at imaging.
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
## 2018-09-10 NOTE — ED Notes (Signed)
CT to bedside but pt not yet back from xray.

## 2018-09-10 NOTE — ED Notes (Signed)
Pt c/o back/neck/armpit soreness; denies hitting head; states HA. Pt A&Ox4. Has full movement of extremities.
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
## 2018-09-11 MED ORDER — METHOCARBAMOL 500 MG PO TABS: 500.0000 mg | ORAL_TABLET | Freq: Three times a day (TID) | ORAL | 0 refills | Status: AC | PRN

## 2018-09-11 MED ORDER — MELOXICAM 15 MG PO TABS: 15.0000 mg | ORAL_TABLET | Freq: Every day | ORAL | 1 refills | Status: AC

## 2018-09-11 NOTE — ED Notes (Signed)
Patient discharged to home per MD order. Patient in stable condition, and deemed medically cleared by ED provider for discharge. Discharge instructions reviewed with patient/family using "Teach Back"; verbalized understanding of medication education and administration, and information about follow-up care. Denies further concerns. ° °

## 2018-12-25 ENCOUNTER — Telehealth: Payer: Self-pay

## 2018-12-25 NOTE — Telephone Encounter (Signed)
Pre-visit screening call was attempted prior to [facility] appointment on 12/29/2018. No answer / left a voicemail - reminded pt of date, time, and appointment location.

## 2018-12-29 ENCOUNTER — Other Ambulatory Visit: Payer: Self-pay

## 2018-12-29 ENCOUNTER — Ambulatory Visit
Admission: RE | Admit: 2018-12-29 | Discharge: 2018-12-29 | Disposition: A | Discharge status: Home / Self Care | Payer: Self-pay | Source: Ambulatory Visit | Attending: Oncology | Admitting: Oncology

## 2018-12-29 ENCOUNTER — Ambulatory Visit: Payer: Self-pay | Attending: Oncology

## 2018-12-29 VITALS — BP 114/85 | HR 64 | Temp 96.0°F | Ht 61.0 in | Wt 152.0 lb

## 2018-12-29 DIAGNOSIS — Z0000 Encounter for general adult medical examination without abnormal findings: Secondary | ICD-10-CM | POA: Insufficient documentation

## 2018-12-29 NOTE — Progress Notes (Signed)
  Subjective:     Patient ID: Heather Ayers, female   DOB: November 23, 1960, 58 y.o.   MRN: [id number]  HPI   Review of Systems     Objective:   Physical Exam Chest:     Breasts:        Right: Inverted nipple present. No swelling, bleeding, mass, nipple discharge, skin change or tenderness.        Left: Inverted nipple and mass present. No swelling, bleeding, nipple discharge, skin change or tenderness.          Assessment:     58 year old patient who has been seen in [facility] clinic for many years, returns for annual screening.  Patient has known left breast nodularity that is unchanged palpated by Tanya Nones RN.  Patient screened, and meets BCCCP eligibility.  Patient does not have insurance, Medicare or Medicaid. Instructed patient on breast self awareness using teach back method.  Clinical breast exam unremarkable.  Risk Assessment    Risk Scores      12/29/2018 09/22/2017   Last edited by: Rico Junker, RN Rico Junker, RN   5-year risk: 2.6 % 2.9 %   Lifetime risk: 13.6 % 15.8 %             Plan:     Sent for bilateral screening mammogram.

## 2018-12-30 ENCOUNTER — Other Ambulatory Visit: Payer: Self-pay

## 2018-12-30 DIAGNOSIS — N63 Unspecified lump in unspecified breast: Secondary | ICD-10-CM

## 2019-01-07 ENCOUNTER — Other Ambulatory Visit: Payer: Self-pay

## 2019-01-07 DIAGNOSIS — N63 Unspecified lump in unspecified breast: Secondary | ICD-10-CM

## 2019-01-13 ENCOUNTER — Other Ambulatory Visit: Payer: Self-pay

## 2019-01-13 ENCOUNTER — Ambulatory Visit
Admission: RE | Admit: 2019-01-13 | Discharge: 2019-01-13 | Disposition: A | Discharge status: Home / Self Care | Payer: Self-pay | Source: Ambulatory Visit | Attending: Oncology | Admitting: Oncology

## 2019-01-13 ENCOUNTER — Encounter: Payer: Self-pay | Admitting: *Deleted

## 2019-01-13 ENCOUNTER — Other Ambulatory Visit: Payer: Self-pay | Admitting: *Deleted

## 2019-01-13 DIAGNOSIS — N63 Unspecified lump in unspecified breast: Secondary | ICD-10-CM | POA: Insufficient documentation

## 2019-01-13 NOTE — Progress Notes (Signed)
Patient called and has questions about her mammogram results and need for a biopsy.  Reviewed results and encouraged patient to get the biopsy. She is agreeable.  She still has concerns about her left breast pain and area of palpable concern since 2008.  Reviewed that the area was biopsied and negative for malignancy in 2008.  She had an ultrasound and mammogram in 2012, and 2015 along with a surgical consult with benign findings.  Offered support.  She is to call with any questions or needs.

## 2019-01-19 ENCOUNTER — Encounter: Payer: Self-pay | Admitting: *Deleted

## 2019-01-19 NOTE — Progress Notes (Signed)
Called and spoke with patient today.  She is unsure about having a biopsy and wanted to wait about six months for another follow up mammogram and ultrasound.  After discussing waiting verses biopsy, she has decided to move forward with a biopsy.  I informed Aldona Bar in the breast center to contact the patient to schedule her biopsy.

## 2019-01-22 ENCOUNTER — Ambulatory Visit: Payer: Self-pay | Attending: Physician Assistant | Admitting: Physical Therapy

## 2019-01-22 ENCOUNTER — Other Ambulatory Visit: Payer: Self-pay

## 2019-01-22 ENCOUNTER — Encounter: Payer: Self-pay | Admitting: Physical Therapy

## 2019-01-22 DIAGNOSIS — M542 Cervicalgia: Secondary | ICD-10-CM | POA: Insufficient documentation

## 2019-01-22 DIAGNOSIS — M25511 Pain in right shoulder: Secondary | ICD-10-CM | POA: Insufficient documentation
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
## 2019-01-26 ENCOUNTER — Encounter: Payer: Self-pay | Admitting: Physical Therapy

## 2019-01-26 ENCOUNTER — Ambulatory Visit: Payer: Self-pay | Admitting: Physical Therapy

## 2019-01-26 ENCOUNTER — Other Ambulatory Visit: Payer: Self-pay

## 2019-01-26 DIAGNOSIS — M25511 Pain in right shoulder: Secondary | ICD-10-CM

## 2019-01-26 DIAGNOSIS — M542 Cervicalgia: Secondary | ICD-10-CM
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
## 2019-02-02 ENCOUNTER — Ambulatory Visit: Payer: Self-pay | Admitting: Physical Therapy

## 2019-02-02 ENCOUNTER — Other Ambulatory Visit: Payer: Self-pay

## 2019-02-02 ENCOUNTER — Encounter: Payer: Self-pay | Admitting: Physical Therapy

## 2019-02-02 DIAGNOSIS — M25511 Pain in right shoulder: Secondary | ICD-10-CM

## 2019-02-02 DIAGNOSIS — M542 Cervicalgia: Secondary | ICD-10-CM
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
## 2019-02-08 ENCOUNTER — Encounter: Payer: Self-pay | Admitting: Physical Therapy

## 2019-02-08 ENCOUNTER — Other Ambulatory Visit: Payer: Self-pay

## 2019-02-08 ENCOUNTER — Ambulatory Visit: Payer: Self-pay | Admitting: Physical Therapy

## 2019-02-08 DIAGNOSIS — M542 Cervicalgia: Secondary | ICD-10-CM

## 2019-02-08 DIAGNOSIS — M25511 Pain in right shoulder: Secondary | ICD-10-CM
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
## 2019-02-10 ENCOUNTER — Encounter: Payer: Self-pay | Admitting: Physical Therapy

## 2019-02-12 ENCOUNTER — Other Ambulatory Visit: Payer: Self-pay

## 2019-02-12 ENCOUNTER — Ambulatory Visit: Payer: Self-pay | Attending: Physician Assistant | Admitting: Physical Therapy

## 2019-02-12 ENCOUNTER — Encounter: Payer: Self-pay | Admitting: Physical Therapy

## 2019-02-12 DIAGNOSIS — M542 Cervicalgia: Secondary | ICD-10-CM | POA: Insufficient documentation

## 2019-02-12 DIAGNOSIS — M25511 Pain in right shoulder: Secondary | ICD-10-CM | POA: Insufficient documentation
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
## 2019-02-17 ENCOUNTER — Ambulatory Visit: Payer: Self-pay | Admitting: Physical Therapy

## 2019-02-19 ENCOUNTER — Encounter: Payer: Self-pay | Admitting: Physical Therapy

## 2019-02-19 ENCOUNTER — Other Ambulatory Visit: Payer: Self-pay

## 2019-02-19 ENCOUNTER — Ambulatory Visit: Payer: Self-pay | Admitting: Physical Therapy

## 2019-02-19 DIAGNOSIS — M542 Cervicalgia: Secondary | ICD-10-CM

## 2019-02-19 DIAGNOSIS — M25511 Pain in right shoulder: Secondary | ICD-10-CM
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
## 2019-02-24 ENCOUNTER — Encounter: Payer: Self-pay | Admitting: Physical Therapy

## 2019-02-24 ENCOUNTER — Other Ambulatory Visit: Payer: Self-pay

## 2019-02-24 ENCOUNTER — Ambulatory Visit: Payer: Self-pay | Admitting: Physical Therapy

## 2019-02-24 DIAGNOSIS — M25511 Pain in right shoulder: Secondary | ICD-10-CM

## 2019-02-24 DIAGNOSIS — M542 Cervicalgia: Secondary | ICD-10-CM

## 2019-02-24 NOTE — Therapy (Signed)
[facility] [address], [place], [address] Phone: [phone]   Fax:  [phone]  Physical Therapy Treatment  Patient Details  Name: Heather Ayers MRN: [id number] Date of Birth: [birth year] No data recorded  Encounter Date: 02/24/2019  PT End of Session - 02/24/19 0951    Visit Number  7    Number of Visits  17    Date for PT Re-Evaluation  03/19/19    PT Start Time  0945    PT Stop Time  1030    PT Time Calculation (min)  45 min    Activity Tolerance  Patient tolerated treatment well    Behavior During Therapy  [facility] for tasks assessed/performed       Past Medical History:  Diagnosis Date  . Fibroid     Past Surgical History:  Procedure Laterality Date  . ABDOMINAL HYSTERECTOMY    . BREAST EXCISIONAL BIOPSY Left 2008   Dr. Pat Patrick, nonproliferative breast tissue left UOQ.   Marland Kitchen BREAST EXCISIONAL BIOPSY Right 1980   neg  . CHOLECYSTECTOMY      There were no vitals filed for this visit.    Ther-Ex UBE forward 71mns backward 26ms AROM and MMT assessment, with good motion and strength   PT led patient through the following with patient able to complete demo of all of the following with min cuing for correction. Patient is able to complete all with min cuing for correction. Education on frequency, rep/set range, how and when to decrease/increase intensity, and on strengthening parameters with good success.  Exercises  Seated Overhead Press with Dumbbells - 5 -10 reps - 3 sets - 1x daily - 2-3x weekly  Standing Bicep Curls Supinated with Dumbbells - 5-10 reps - 3 sets - 1x daily - 2-3x weekly  Scaption with Dumbbells - 5-10 reps - 3 sets - 1x daily - 2-3x weekly  Standing High Row with Resistance - 5-10 reps - 3 sets - 1x daily - 2-3x weekly  Prone Scapular Retraction Y - 5-10 reps - 3 sets - 1x daily - 2-3x weekly  Prone Shoulder Horizontal Abduction with Thumbs Up - 5-10 reps - 3 sets - 1x  daily - 2-3x weekly  Prone Shoulder Extension - 5-10 reps - 3 sets - 1x daily - 2-3x weekly  Standing Row with Anchored Resistance - 5-10 reps - 3 sets - 1x daily - 2-3x weekly  Standing Bent Over Shoulder Row - 5-10 reps - 3 sets - 1x daily - 2-3x weekly    Pt with some pain over proximal bicep insertion this session. PT advised patient in postural correction with good carry over and anatomical model to demonstrate impingement with good understanding. Advised patient in cryotherapy for signs and symptoms of inflammation and what those are with good understanding.                      PT Education - 02/24/19 0950    Education Details  therex form    Person(s) Educated  Patient    Methods  Explanation;Demonstration;Verbal cues    Comprehension  Verbalized understanding;Returned demonstration;Verbal cues required       PT Short Term Goals - 01/22/19 1124      PT SHORT TERM GOAL #1   Title  Pt will be independent with HEP in order to improve strength and balance in order to decrease fall risk and improve function at home and work.  Baseline  11/03/13/18 HEP given    Time  4    Period  Weeks    Status  New        PT Long Term Goals - 02/24/19 0952      PT LONG TERM GOAL #1   Title  Patient will demonstrate full cervical rotation AROM in order to drive safely    Baseline  02/24/19 all cervical motions WNL    Time  8    Period  Weeks      PT LONG TERM GOAL #2   Title  Patient will demonstrate gross, symmetrical 4+ shoulder and periscapular strength in order to complete heavy household chores    Baseline  02/24/19 R/L Shoulder flex 4+/5 ABD 5/5 ER 5/5 IR 5/5 Ext 5/5    Time  8    Period  Weeks    Status  Achieved      PT LONG TERM GOAL #3   Title  Pt will decrease worst neck pain as reported on NPRS by at least 2 points in order to demonstrate clinically significant reduction in back pain.    Baseline  02/24/19 No pain    Time  8    Period  Weeks    Status   Achieved      PT LONG TERM GOAL #4   Title  Pt will decrease quick DASH score by at least 8% in order to demonstrate clinically significant reduction in disability.    Baseline  02/24/19 36%    Time  6    Period  Weeks    Status  Achieved            Plan - 02/24/19 1110    Clinical Impression Statement  PT reassessed goals this session which patient has met all of to safely DC PT to robust HEP. Patient able to demonstrate and verbalize understanding of all therex and DC recommendations well. Patient given Pt clinic contact info should any further questions/concerns arise.    Personal Factors and Comorbidities  Age;Behavior Pattern;Fitness;Past/Current Experience    Examination-Activity Limitations  Reach Overhead;Carry;Hygiene/Grooming;Sleep    Examination-Participation Restrictions  Community Activity;Driving;Laundry;Cleaning    Stability/Clinical Decision Making  Evolving/Moderate complexity    Clinical Decision Making  Moderate    Rehab Potential  Good    PT Frequency  2x / week    PT Duration  8 weeks    PT Treatment/Interventions  ADLs/Self Care Home Management;Ultrasound;Functional mobility training;Neuromuscular re-education;Spinal Manipulations;Joint Manipulations;Dry needling;Manual lymph drainage;Passive range of motion;Manual techniques;Patient/family education;Therapeutic exercise;Therapeutic activities;Moist Heat;Traction;Iontophoresis 65m/ml Dexamethasone;Electrical Stimulation;Cryotherapy    PT Next Visit Plan  Increase cervical ROM, R shoulder ROM, pain management, TDN?    PT Home Exercise Plan  cervical SNAG rotation, thoracic ext hands behind head, BUE flex table slides    Consulted and Agree with Plan of Care  Patient       Patient will benefit from skilled therapeutic intervention in order to improve the following deficits and impairments:  Decreased endurance, Decreased mobility, Increased muscle spasms, Decreased range of motion, Impaired tone, Improper body  mechanics, Decreased activity tolerance, Decreased coordination, Decreased strength, Increased fascial restricitons, Impaired flexibility, Impaired UE functional use, Postural dysfunction, Pain  Visit Diagnosis: Cervicalgia  Acute pain of right shoulder     Problem List Patient Active Problem List   Diagnosis Date Noted  . Mastalgia 01/11/2014   CShelton SilvasPT, DPT CShelton Silvas12/16/2020, 11:14 AM  [facility] [address]  [place], [place], [address] Phone: [phone]   Fax:  [phone]  Name: Heather Ayers MRN: [id number] Date of Birth: 04-22-1960

## 2019-02-25 ENCOUNTER — Ambulatory Visit
Admission: RE | Admit: 2019-02-25 | Discharge: 2019-02-25 | Disposition: A | Discharge status: Home / Self Care | Payer: Self-pay | Source: Ambulatory Visit | Attending: Oncology | Admitting: Oncology

## 2019-02-25 DIAGNOSIS — N63 Unspecified lump in unspecified breast: Secondary | ICD-10-CM

## 2019-02-26 ENCOUNTER — Encounter: Payer: Self-pay | Admitting: Physical Therapy

## 2019-03-01 ENCOUNTER — Encounter: Payer: Self-pay | Admitting: Physical Therapy

## 2019-03-03 ENCOUNTER — Encounter: Payer: Self-pay | Admitting: Physical Therapy

## 2019-03-08 ENCOUNTER — Encounter: Payer: Self-pay | Admitting: Physical Therapy

## 2019-03-10 ENCOUNTER — Encounter: Payer: Self-pay | Admitting: Physical Therapy

## 2019-03-17 ENCOUNTER — Encounter: Payer: Self-pay | Admitting: Physical Therapy

## 2019-03-19 ENCOUNTER — Encounter: Payer: Self-pay | Admitting: Physical Therapy

## 2019-03-24 ENCOUNTER — Encounter: Payer: Self-pay | Admitting: Physical Therapy

## 2019-03-26 ENCOUNTER — Encounter: Payer: Self-pay | Admitting: Physical Therapy

## 2019-03-31 ENCOUNTER — Encounter: Payer: Self-pay | Admitting: Physical Therapy

## 2019-04-02 ENCOUNTER — Encounter: Payer: Self-pay | Admitting: Physical Therapy

## 2019-04-07 ENCOUNTER — Encounter: Payer: Self-pay | Admitting: Physical Therapy

## 2019-04-09 ENCOUNTER — Encounter: Payer: Self-pay | Admitting: Physical Therapy

## 2019-11-01 ENCOUNTER — Other Ambulatory Visit: Payer: Self-pay

## 2019-11-01 DIAGNOSIS — N63 Unspecified lump in unspecified breast: Secondary | ICD-10-CM

## 2019-11-01 NOTE — Progress Notes (Signed)
Will schedule bilateral annual BCCCP appointment and follow-up mammogram.  Copy to [facility].

## 2019-11-02 NOTE — Progress Notes (Signed)
Patient scheduled for annual BCCCP screening, and right breast mass follow-up mammogram on 12/15/19 at 0800.  Letter mailed with appointment information. Copy to [facility].

## 2019-12-13 NOTE — Progress Notes (Signed)
A TELEVISIT was used to enroll patient into our BCCCP program.  2 identifers used to confirm I was talking to the correct patient.  Verbal consent given to enroll in BCCCP.   Health history obtained.  Patient is reporting directly to [facility] today for diagnostic mammogram and ultrasound.

## 2019-12-15 ENCOUNTER — Ambulatory Visit: Payer: Self-pay

## 2019-12-15 ENCOUNTER — Ambulatory Visit
Admission: RE | Admit: 2019-12-15 | Discharge: 2019-12-15 | Disposition: A | Discharge status: Home / Self Care | Payer: Self-pay | Source: Ambulatory Visit | Attending: Oncology | Admitting: Oncology

## 2019-12-15 ENCOUNTER — Other Ambulatory Visit: Payer: Self-pay

## 2019-12-15 ENCOUNTER — Ambulatory Visit: Payer: Self-pay | Attending: Oncology

## 2019-12-15 DIAGNOSIS — N63 Unspecified lump in unspecified breast: Secondary | ICD-10-CM

## 2019-12-23 NOTE — Progress Notes (Signed)
[facility] data sheet complete.  Thank you Alyse Low!!!

## 2020-07-29 ENCOUNTER — Ambulatory Visit (INDEPENDENT_AMBULATORY_CARE_PROVIDER_SITE_OTHER): Payer: Self-pay

## 2020-07-29 ENCOUNTER — Encounter: Payer: Self-pay | Admitting: Emergency Medicine

## 2020-07-29 ENCOUNTER — Ambulatory Visit
Admission: EM | Admit: 2020-07-29 | Discharge: 2020-07-29 | Disposition: A | Discharge status: Home / Self Care | Payer: Self-pay | Attending: Family Medicine | Admitting: Family Medicine

## 2020-07-29 ENCOUNTER — Other Ambulatory Visit: Payer: Self-pay

## 2020-07-29 DIAGNOSIS — M546 Pain in thoracic spine: Secondary | ICD-10-CM

## 2020-07-29 DIAGNOSIS — M542 Cervicalgia: Secondary | ICD-10-CM

## 2020-07-29 DIAGNOSIS — M549 Dorsalgia, unspecified: Secondary | ICD-10-CM

## 2020-07-29 DIAGNOSIS — M545 Low back pain, unspecified: Secondary | ICD-10-CM

## 2020-07-29 MED ORDER — MELOXICAM 15 MG PO TABS: 15.0000 mg | ORAL_TABLET | Freq: Every day | ORAL | 0 refills | Status: DC | PRN

## 2020-07-29 NOTE — Discharge Instructions (Signed)
Xrays normal.  Rest.  Heat.  Medication as needed.  Take care  Dr. Lacinda Axon

## 2020-07-29 NOTE — ED Triage Notes (Signed)
Patient states this morning she slipped on the floor at a store this morning and tried to catch herself to prevent her from falling to the floor.  Patient states that she jerked her back and has been having pain in her mid back.  Patient states that she does have a bulging disc in her back.

## 2020-07-30 NOTE — ED Provider Notes (Signed)
[facility]    CSN: [id number] Arrival date & time: 07/29/20  1428      History   Chief Complaint Chief Complaint  Patient presents with  . Back Pain  . Fall   HPI  60 year old female presents with the above complaints.  Patient states that she slipped on wet floor at a store this morning.  She did not actually fall.  But essentially injured herself when she slipped.  She states that she jerked back.  She is having neck pain, back pain.  She reports numbness and tingling in her right leg as well as her right hand.  Patient reports that she has bulging discs in her back.  She is unsure if she further injured anything else. Pain 7/10 in severity. No relieving factors.   Past Medical History:  Diagnosis Date  . Fibroid     Patient Active Problem List   Diagnosis Date Noted  . Mastalgia 01/11/2014    Past Surgical History:  Procedure Laterality Date  . ABDOMINAL HYSTERECTOMY    . BREAST EXCISIONAL BIOPSY Left 2008   Dr. Pat Patrick, nonproliferative breast tissue left UOQ.   Marland Kitchen BREAST EXCISIONAL BIOPSY Right 1980   neg  . CHOLECYSTECTOMY      OB History    Gravida  1   Para  1   Term  1   Preterm      AB      Living  1     SAB      IAB      Ectopic      Multiple      Live Births  1        Obstetric Comments  1st Menstrual Cycle:  13 1st Pregnancy:  19         Home Medications    Prior to Admission medications   Medication Sig Start Date End Date Taking? Authorizing Provider  meloxicam (MOBIC) 15 MG tablet Take 1 tablet (15 mg total) by mouth daily as needed for pain. 07/29/20  Yes Coral Spikes, DO    Family History Family History  Problem Relation Age of Onset  . Breast cancer Sister 2  . Diabetes Sister   . Diabetes Brother   . Ovarian cancer Neg Hx   . Colon cancer Neg Hx     Social History Social History   Tobacco Use  . Smoking status: Never Smoker  . Smokeless tobacco: Never Used  Vaping Use  . Vaping Use: Never  used  Substance Use Topics  . Alcohol use: Yes    Alcohol/week: 0.0 standard drinks    Comment: rare  . Drug use: No     Allergies   Codeine   Review of Systems Review of Systems Per HPI  Physical Exam Triage Vital Signs ED Triage Vitals  Enc Vitals Group     BP 07/29/20 1450 107/72     Pulse Rate 07/29/20 1450 63     Resp 07/29/20 1450 14     Temp 07/29/20 1450 98.6 F (37 C)     Temp Source 07/29/20 1450 Oral     SpO2 07/29/20 1450 99 %     Weight 07/29/20 1447 145 lb (65.8 kg)     Height 07/29/20 1447 5\' 2"  (1.575 m)     Head Circumference --      Peak Flow --      Pain Score 07/29/20 1447 7     Pain Loc --  Pain Edu? --      Excl. in [place]? --    Updated Vital Signs BP 107/72 (BP Location: Right Arm)   Pulse 63   Temp 98.6 F (37 C) (Oral)   Resp 14   Ht 5\' 2"  (1.575 m)   Wt 65.8 kg   SpO2 99%   BMI 26.52 kg/m   Visual Acuity Right Eye Distance:   Left Eye Distance:   Bilateral Distance:    Right Eye Near:   Left Eye Near:    Bilateral Near:     Physical Exam Vitals and nursing note reviewed.  Constitutional:      General: She is not in acute distress.    Appearance: Normal appearance. She is not ill-appearing.  HENT:     Head: Normocephalic and atraumatic.  Eyes:     General:        Right eye: No discharge.        Left eye: No discharge.     Conjunctiva/sclera: Conjunctivae normal.  Cardiovascular:     Rate and Rhythm: Normal rate and regular rhythm.  Pulmonary:     Effort: Pulmonary effort is normal.     Breath sounds: Normal breath sounds. No wheezing, rhonchi or rales.  Musculoskeletal:     Comments: Lumbar spine - mild paraspinal muscular tenderness to palpation.   Neurological:     Mental Status: She is alert.  Psychiatric:        Mood and Affect: Mood normal.        Behavior: Behavior normal.    UC Treatments / Results  Labs (all labs ordered are listed, but only abnormal results are displayed) Labs Reviewed - No data  to display  EKG   Radiology DG Cervical Spine Complete  Result Date: 07/29/2020 CLINICAL DATA:  Back pain after almost falling. EXAM: CERVICAL SPINE - COMPLETE 4+ VIEW COMPARISON:  None. FINDINGS: Early disc space narrowing and spurring at C5-6 and C6-7. Normal alignment. No fracture. Prevertebral soft tissues are normal. IMPRESSION: No acute bony abnormality. Electronically Signed   By: Rolm Baptise M.D.   On: 07/29/2020 16:05   DG Thoracic Spine 2 View  Result Date: 07/29/2020 CLINICAL DATA:  Back pain after nearly falling EXAM: THORACIC SPINE 2 VIEWS COMPARISON:  09/10/2018 FINDINGS: There is no evidence of thoracic spine fracture. Alignment is normal. No other significant bone abnormalities are identified. IMPRESSION: Negative. Electronically Signed   By: Rolm Baptise M.D.   On: 07/29/2020 16:05   DG Lumbar Spine Complete  Result Date: 07/29/2020 CLINICAL DATA:  Back pain after nearly falling EXAM: LUMBAR SPINE - COMPLETE 4+ VIEW COMPARISON:  None. FINDINGS: There is no evidence of lumbar spine fracture. Alignment is normal. Intervertebral disc spaces are maintained. IMPRESSION: Negative. Electronically Signed   By: Rolm Baptise M.D.   On: 07/29/2020 16:05    Procedures Procedures (including critical care time)  Medications Ordered in UC Medications - No data to display  Initial Impression / Assessment and Plan / UC Course  I have reviewed the triage vital signs and the nursing notes.  Pertinent labs & imaging results that were available during my care of the patient were reviewed by me and considered in my medical decision making (see chart for details).    60 year old female presents with musculoskeletal pain. Xrays obtained and independently reviewed by me.  X-rays of the cervical spine, thoracic spine, and lumbar spine were unremarkable.  No evidence of fracture.  Treating with meloxicam and supportive  care.  Final Clinical Impressions(s) / UC Diagnoses   Final diagnoses:   Acute bilateral back pain, unspecified back location  Neck pain     Discharge Instructions     Xrays normal.  Rest.  Heat.  Medication as needed.  Take care  Dr. Lacinda Axon    ED Prescriptions    Medication Sig Dispense Auth. Provider   meloxicam (MOBIC) 15 MG tablet Take 1 tablet (15 mg total) by mouth daily as needed for pain. 30 tablet Coral Spikes, DO     PDMP not reviewed this encounter.   Thersa Salt [place], [place] 07/30/20 [phone]

## 2020-09-07 ENCOUNTER — Other Ambulatory Visit: Payer: Self-pay

## 2020-09-07 ENCOUNTER — Emergency Department
Admission: EM | Admit: 2020-09-07 | Discharge: 2020-09-07 | Disposition: A | Discharge status: Home / Self Care | Payer: Self-pay | Attending: Emergency Medicine | Admitting: Emergency Medicine

## 2020-09-07 ENCOUNTER — Emergency Department: Payer: Self-pay

## 2020-09-07 DIAGNOSIS — W010XXA Fall on same level from slipping, tripping and stumbling without subsequent striking against object, initial encounter: Secondary | ICD-10-CM | POA: Insufficient documentation

## 2020-09-07 DIAGNOSIS — M25552 Pain in left hip: Secondary | ICD-10-CM | POA: Insufficient documentation

## 2020-09-07 MED ORDER — MELOXICAM 15 MG PO TABS: 15.0000 mg | ORAL_TABLET | Freq: Every day | ORAL | 2 refills | Status: AC

## 2020-09-07 MED ORDER — ONDANSETRON 4 MG PO TBDP: 4.0000 mg | ORAL_TABLET | Freq: Four times a day (QID) | ORAL | 0 refills | Status: DC | PRN

## 2020-09-07 MED ORDER — HYDROCODONE-ACETAMINOPHEN 5-325 MG PO TABS: 1.0000 | ORAL_TABLET | ORAL | 0 refills | Status: DC | PRN

## 2020-09-07 NOTE — ED Triage Notes (Signed)
Pt arrives via pov from home with c/o left hip. Ambulatory to triage. Reports pain started after a fall 1 month ago. Pt states "its just not getting better and I cant sleep at night". NAD noted at this time

## 2020-09-07 NOTE — Discharge Instructions (Addendum)

## 2020-09-07 NOTE — ED Notes (Signed)
Pt A&OX4 at d/c, verbalized understanding of d/c instructions, prescriptions and follow up care. Pt ambulatory but wheeled out of ED via wheelchair.

## 2020-09-07 NOTE — ED Notes (Signed)
Patient to room 4 via wheelchair.  No acute distress noted.

## 2020-09-07 NOTE — ED Provider Notes (Signed)
[facility] Emergency Department Provider Note  ____________________________________________   Event Date/Time   First MD Initiated Contact with Patient 09/07/20 0602     (approximate)  I have reviewed the triage vital signs and the nursing notes.   HISTORY  Chief Complaint Hip Pain    HPI Heather Ayers is a 60 y.o. female who presents to the emergency department with complaints of left hip pain that started on May 21 after she slipped on water and fell onto her left hip.  States she was seen at urgent care and had negative x-rays and was started on Mobic.  She states Mobic will help her pain intermittently until it wears off.  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  No numbness, tingling or weakness.  No back pain.  She is able to ambulate.  She has not seen an orthopedic physician for this.  No previous hip surgeries.  She did drive herself to the emergency department.        Past Medical History:  Diagnosis Date   Fibroid     Patient Active Problem List   Diagnosis Date Noted   Mastalgia 01/11/2014    Past Surgical History:  Procedure Laterality Date   ABDOMINAL HYSTERECTOMY     BREAST EXCISIONAL BIOPSY Left 2008   Dr. Pat Patrick, nonproliferative breast tissue left UOQ.    BREAST EXCISIONAL BIOPSY Right 1980   neg   CHOLECYSTECTOMY      Prior to Admission medications   Medication Sig Start Date End Date Taking? Authorizing Provider  HYDROcodone-acetaminophen (NORCO/VICODIN) 5-325 MG tablet Take 1 tablet by mouth every 4 (four) hours as needed. 09/07/20  Yes Tomorrow Dehaas, Delice Bison, DO  meloxicam (MOBIC) 15 MG tablet Take 1 tablet (15 mg total) by mouth daily. 09/07/20 09/07/21 Yes Racheal Mathurin, Delice Bison, DO  ondansetron (ZOFRAN ODT) 4 MG disintegrating tablet Take 1 tablet (4 mg total) by mouth every 6 (six) hours as needed for nausea or vomiting. 09/07/20  Yes Farron Lafond, Delice Bison, DO     Allergies Codeine  Family History  Problem Relation Age of Onset   Breast cancer Sister 33   Diabetes Sister    Diabetes Brother    Ovarian cancer Neg Hx    Colon cancer Neg Hx     Social History Social History   Tobacco Use   Smoking status: Never   Smokeless tobacco: Never  Vaping Use   Vaping Use: Never used  Substance Use Topics   Alcohol use: Yes    Alcohol/week: 0.0 standard drinks    Comment: rare   Drug use: No    Review of Systems Constitutional: No fever. Eyes: No visual changes. ENT: No sore throat. Cardiovascular: Denies chest pain. Respiratory: Denies shortness of breath. Gastrointestinal: No nausea, vomiting, diarrhea. Genitourinary: Negative for dysuria. Musculoskeletal: Negative for back pain. Skin: Negative for rash. Neurological: Negative for focal weakness or numbness.  ____________________________________________   PHYSICAL EXAM:  VITAL SIGNS: ED Triage Vitals  Enc Vitals Group     BP 09/07/20 0528 114/79     Pulse Rate 09/07/20 0528 64     Resp 09/07/20 0528 16     Temp 09/07/20 0528 98.5 F (36.9 C)     Temp Source 09/07/20 0528 Oral     SpO2 09/07/20 0528 100 %     Weight 09/07/20 0529 144 lb (65.3 kg)     Height 09/07/20 0529  5\' 2"  (1.575 m)     Head Circumference --      Peak Flow --      Pain Score 09/07/20 0529 10     Pain Loc --      Pain Edu? --      Excl. in [place]? --    CONSTITUTIONAL: Alert and responds appropriately to questions. Well-appearing; well-nourished HEAD: Normocephalic, atraumatic EYES: Conjunctivae clear, pupils appear equal ENT: normal nose; moist mucous membranes NECK: Normal range of motion CARD: Regular rate and rhythm RESP: Normal chest excursion without splinting or tachypnea; no hypoxia or respiratory distress, speaking full sentences ABD/GI: non-distended EXT: Patient is tender to palpation over the lateral left hip without redness, warmth.  No overlying skin changes.  She has pain with both  internal and external rotation but has full range of motion in the hip.  2+ left DP pulse on exam.  No calf tenderness or calf swelling.  No tenderness over the left knee, ankle or foot.  Normal capillary refill and normal sensation.  Compartments of the left lower extremity are soft. BACK: No midline spinal tenderness or step-off or deformity SKIN: Normal color for age and race, no rashes on exposed skin NEURO: Moves all extremities equally, normal speech, no facial asymmetry noted, ambulates with normal gait PSYCH: The patient's mood and manner are appropriate. Grooming and personal hygiene are appropriate.  ____________________________________________   LABS (all labs ordered are listed, but only abnormal results are displayed)  Labs Reviewed - No data to display ____________________________________________  EKG   ____________________________________________  RADIOLOGY I, Earnestine Shipp, personally viewed and evaluated these images (plain radiographs) as part of my medical decision making, as well as reviewing the written report by the radiologist.  ED MD interpretation: X-ray shows no acute abnormality.  Official radiology report(s): DG Hip Unilat W or Wo Pelvis 2-3 Views Left  Result Date: 09/07/2020 CLINICAL DATA:  60 year old female with fall 1 month ago and continued left hip pain. EXAM: DG HIP (WITH OR WITHOUT PELVIS) 2-3V LEFT COMPARISON:  Left hip series 01/15/2011. FINDINGS: Bone mineralization is within normal limits. Femoral heads are normally located. Hip joint spaces appear symmetric and normal. Intact pelvis. SI joints appear normal. Grossly normal proximal right femur. Proximal left femur appears stable and intact. Unremarkable visible lumbar vertebrae. Numerous chronic pelvic phleboliths incidentally noted. Negative visible other lower abdominal and pelvic visceral contours. IMPRESSION: No osseous abnormality identified. Negative radiographic appearance of the left hip,  stable since 2012. Electronically Signed   By: Genevie Ann M.D.   On: 09/07/2020 05:58    ____________________________________________   PROCEDURES  Procedure(s) performed (including Critical Care):  Procedures   ____________________________________________   INITIAL IMPRESSION / ASSESSMENT AND PLAN / ED COURSE  As part of my medical decision making, I reviewed the following data within the [place] notes reviewed and incorporated, Old chart reviewed, Radiograph reviewed , Notes from prior ED visits, and [place] Controlled Substance Database         Patient here with left hip pain since she had a fall about a month ago.  No signs of arterial obstruction, DVT, compartment syndrome, gout, septic arthritis, sciatica.  X-ray shows no fracture or dislocation.  Neurovascularly intact distally.  Recommended that she continue her Mobic given this has been helping her pain but also will prescribe additional pain medication and provide outpatient orthopedic follow-up.  Patient drove herself to the emergency department and would like to drive home.  Have offered her  anti-inflammatories including IM Toradol here which she declines.  She states she will pick her medications up from the pharmacy today.  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  Discussed return precautions.  Patient verbalized understanding and is comfortable with this plan.  At this time, I do not feel there is any life-threatening condition present. I have reviewed, interpreted and discussed all results (EKG, imaging, lab, urine as appropriate) and exam findings with patient/family. I have reviewed nursing notes and appropriate previous records.  I feel the patient is safe to be discharged home without further emergent workup and can continue workup as an outpatient as needed.  Discussed usual and customary return precautions. Patient/family verbalize understanding and are comfortable with this plan.  Outpatient follow-up has been provided as needed. All questions have been answered.   ____________________________________________   FINAL CLINICAL IMPRESSION(S) / ED DIAGNOSES  Final diagnoses:  Left hip pain     ED Discharge Orders          Ordered    meloxicam (MOBIC) 15 MG tablet  Daily        09/07/20 0616    HYDROcodone-acetaminophen (NORCO/VICODIN) 5-325 MG tablet  Every 4 hours PRN        09/07/20 0616    ondansetron (ZOFRAN ODT) 4 MG disintegrating tablet  Every 6 hours PRN        09/07/20 0616            *Please note:  Heather Ayers was evaluated in Emergency Department on 09/07/2020 for the symptoms described in the history of present illness. She was evaluated in the context of the global COVID-19 pandemic, which necessitated consideration that the patient might be at risk for infection with the SARS-CoV-2 virus that causes COVID-19. Institutional protocols and algorithms that pertain to the evaluation of patients at risk for COVID-19 are in a state of rapid change based on information released by regulatory bodies including the CDC and federal and state organizations. These policies and algorithms were followed during the patient's care in the ED.  Some ED evaluations and interventions may be delayed as a result of limited staffing during and the pandemic.*   Note:  This document was prepared using Dragon voice recognition software and may include unintentional dictation errors.    Sarafina Puthoff, Delice Bison, DO 09/07/20 [phone]

## 2020-09-21 ENCOUNTER — Other Ambulatory Visit (HOSPITAL_COMMUNITY): Payer: Self-pay | Admitting: Physician Assistant

## 2020-09-21 ENCOUNTER — Other Ambulatory Visit: Payer: Self-pay | Admitting: Physician Assistant

## 2020-09-21 DIAGNOSIS — M25552 Pain in left hip: Secondary | ICD-10-CM

## 2020-10-03 ENCOUNTER — Other Ambulatory Visit: Payer: Self-pay

## 2020-10-03 ENCOUNTER — Ambulatory Visit
Admission: RE | Admit: 2020-10-03 | Discharge: 2020-10-03 | Disposition: A | Discharge status: Home / Self Care | Payer: Self-pay | Source: Ambulatory Visit | Attending: Physician Assistant | Admitting: Physician Assistant

## 2020-10-03 DIAGNOSIS — M25552 Pain in left hip: Secondary | ICD-10-CM | POA: Insufficient documentation

## 2021-03-28 IMAGING — MG MM DIGITAL DIAGNOSTIC UNILAT*R* W/ TOMO W/ CAD
8 series · 8 of 24 positions shown · non-contrast
Comparison: Previous exam(s).
COMPARISON: Previous exam(s).

Addendum:
CLINICAL DATA: Patient was called back from screening mammogram for
a possible asymmetry in the right breast. Strong family history of
breast cancer. The patient's sister passed away from breast cancer
at the age of 46.

EXAM:
DIGITAL DIAGNOSTIC RIGHT MAMMOGRAM WITH CAD AND TOMO
ULTRASOUND RIGHT BREAST

[R MLO synth-2D (1 of 2)]
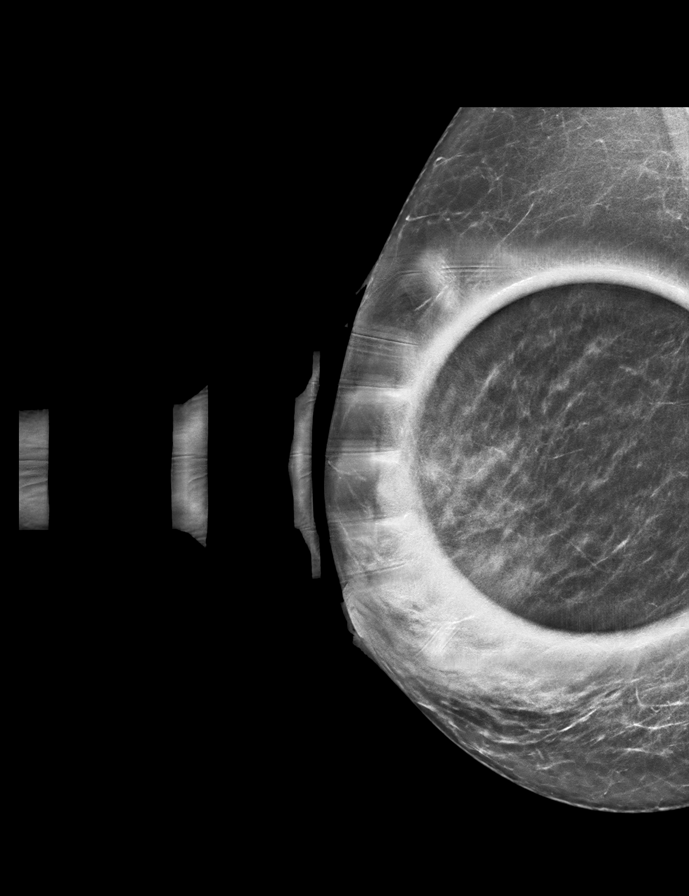

[R CC synth-2D]
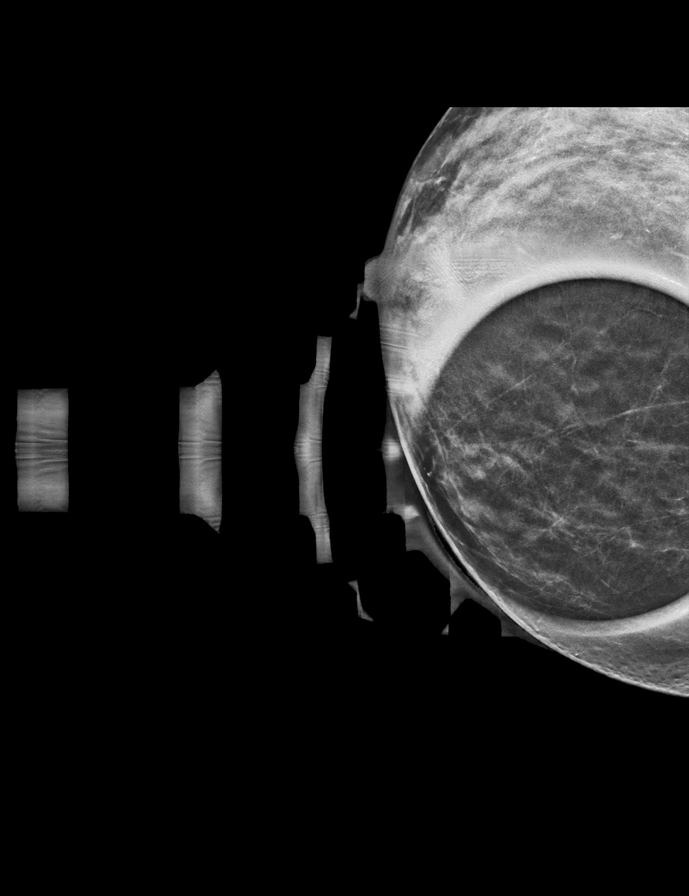

[R MLO synth-2D (2 of 2)]
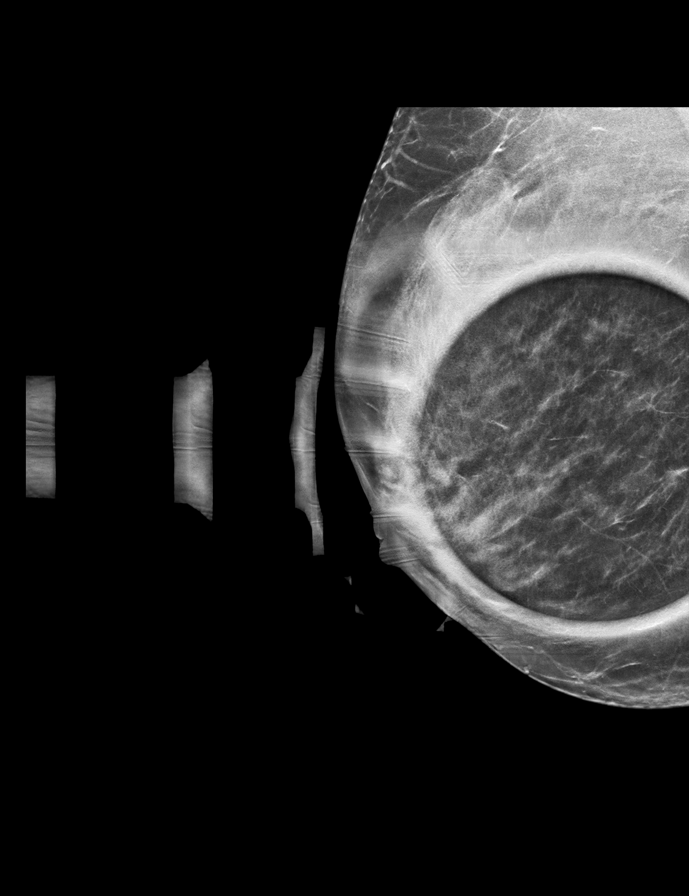

[R ML synth-2D]
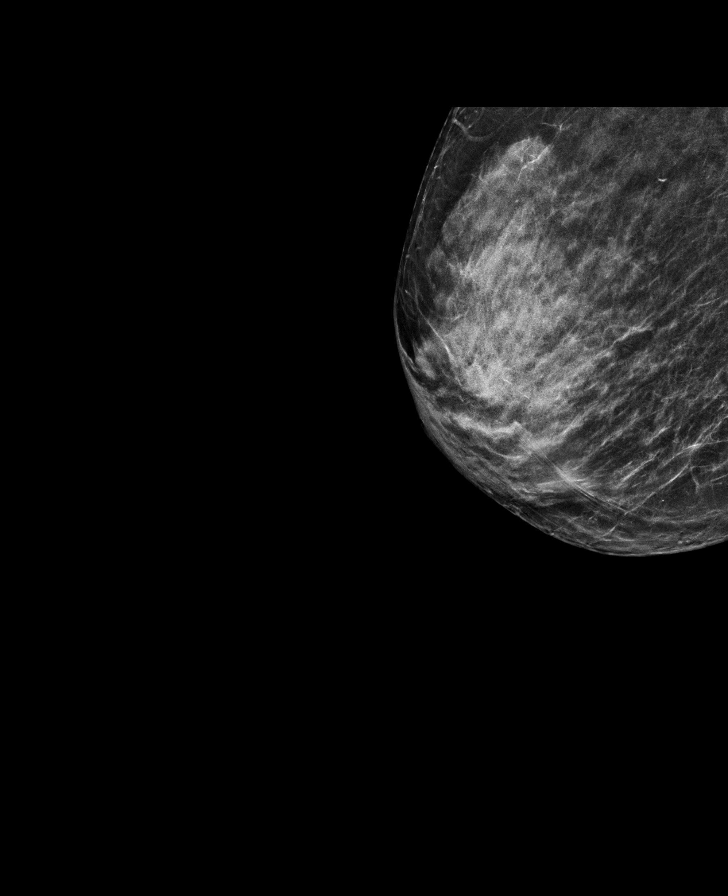

[R ML tomo · tomo slice 30/59.0]
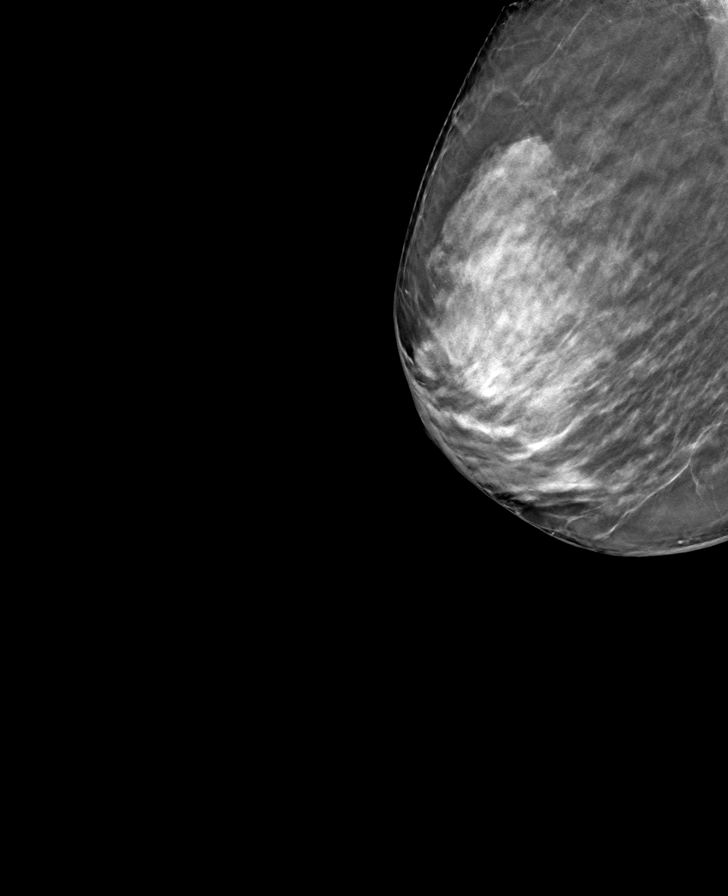

[R MLO tomo (1 of 2) · tomo slice 27/53.0]
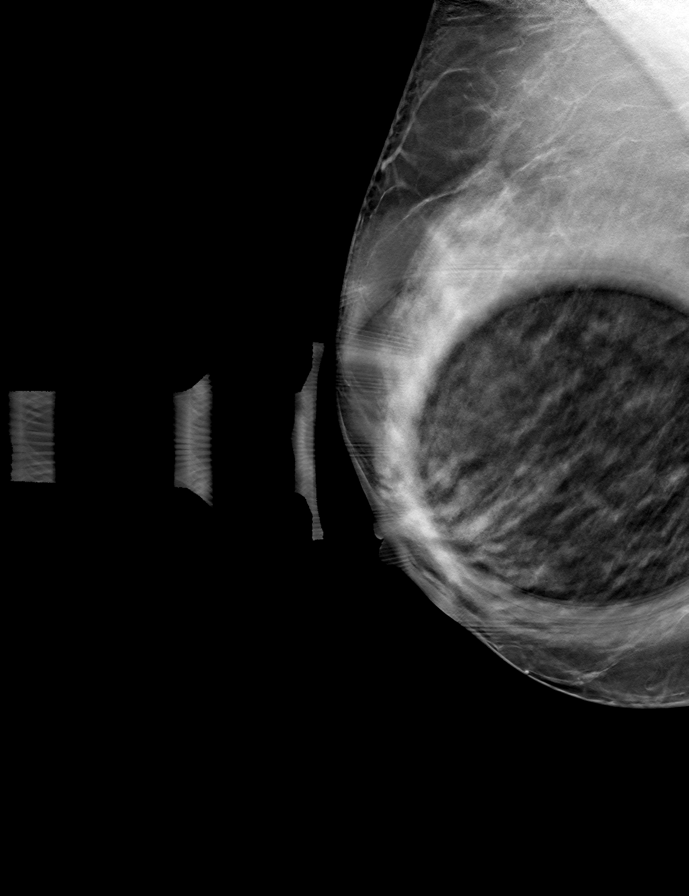

[R CC tomo · tomo slice 22/43.0]
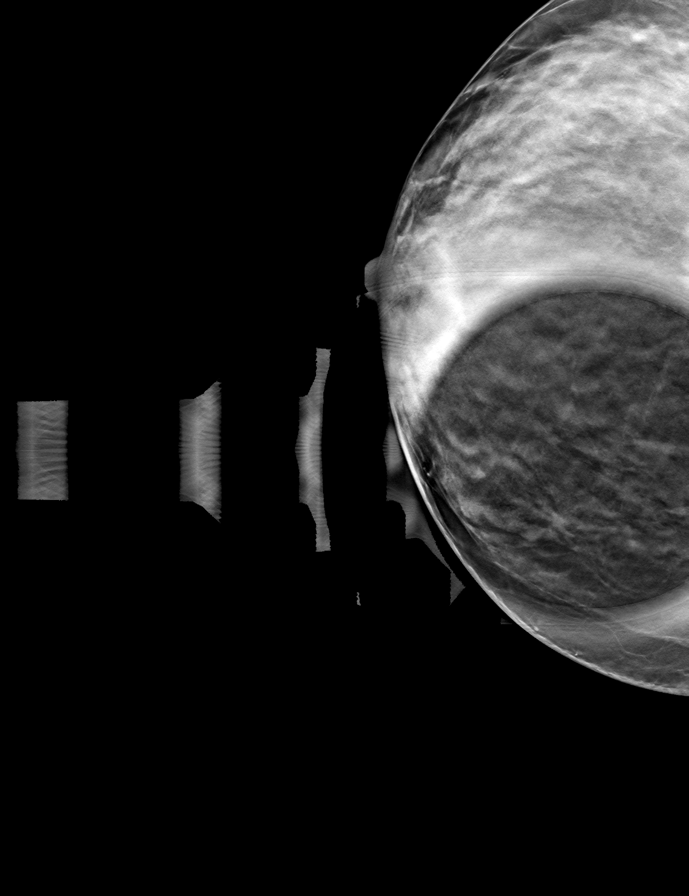

[R MLO tomo (2 of 2) · tomo slice 29/57.0]
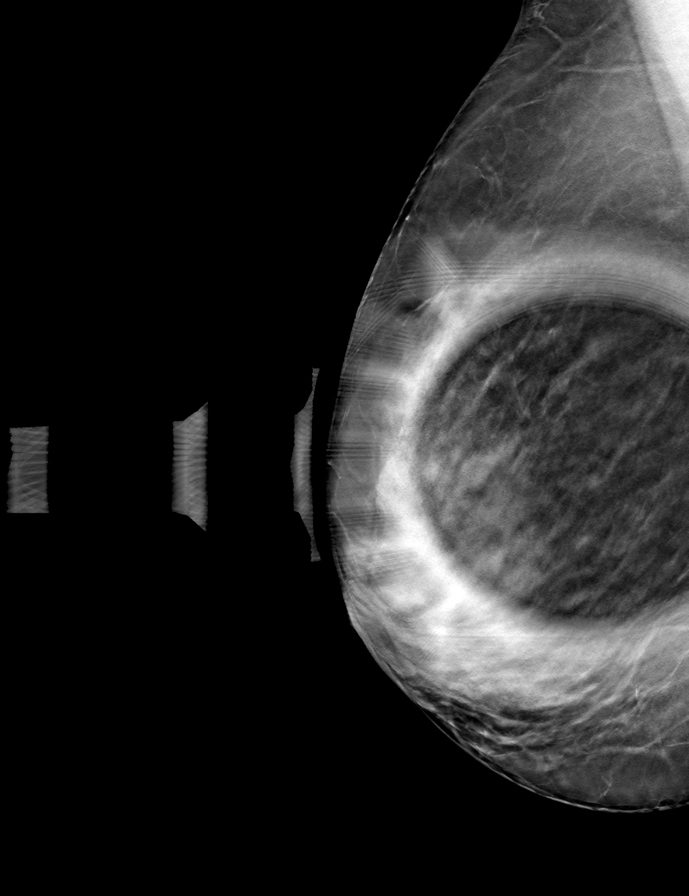

[8 of 24 positions shown; findings below may reference images not displayed]

ACR Breast Density Category c: The breast tissue is heterogeneously
dense, which may obscure small masses.
FINDINGS: Additional imaging of the right breast was performed. There is
persistence of a 9 mm mass in the upper inner quadrant of the
breast. There are no malignant type microcalcifications.

Mammographic images were processed with CAD.

On physical exam, I do not palpate a mass in the upper-inner
quadrant of the right breast.

Targeted ultrasound is performed, showing there is a hypoechoic mass
at 2 o'clock 2 cm from the nipple measuring 5 x 4 x 6 mm.
Sonographic evaluation the right axilla does not show any enlarged
adenopathy.
IMPRESSION: Indeterminate mass in the 2 o'clock region of the right breast 3 cm
from the nipple.

RECOMMENDATION:
Ultrasound-guided core biopsy of the mass in the 2 o'clock region of
the right breast is recommended.

I have discussed the findings and recommendations with the patient.
If applicable, a reminder letter will be sent to the patient
regarding the next appointment.

BI-RADS CATEGORY  4: Suspicious.

ADDENDUM:
Patient was brought back for ultrasound-guided core needle biopsy on
02/25/2019. The case was reviewed and compared with multiple prior
exams. The small oval circumscribed mass within the right breast
appears to be mammographically stable dating back to 0177. This was
discussed with the patient. It was elected for the patient to return
in 6 months for a follow-up right breast diagnostic mammogram and
ultrasound to ensure stability of this mass which is felt to be
probably benign.

Recommendation:

Follow-up right breast diagnostic mammogram and ultrasound in 6
months.

BI-RADS category:

3: Probably benign.

*** End of Addendum ***
ACR Breast Density Category c: The breast tissue is heterogeneously
dense, which may obscure small masses.
FINDINGS: Additional imaging of the right breast was performed. There is
persistence of a 9 mm mass in the upper inner quadrant of the
breast. There are no malignant type microcalcifications.

Mammographic images were processed with CAD.

On physical exam, I do not palpate a mass in the upper-inner
quadrant of the right breast.

Targeted ultrasound is performed, showing there is a hypoechoic mass
at 2 o'clock 2 cm from the nipple measuring 5 x 4 x 6 mm.
Sonographic evaluation the right axilla does not show any enlarged
adenopathy.
IMPRESSION: Indeterminate mass in the 2 o'clock region of the right breast 3 cm
from the nipple.

RECOMMENDATION:
Ultrasound-guided core biopsy of the mass in the 2 o'clock region of
the right breast is recommended.

I have discussed the findings and recommendations with the patient.
If applicable, a reminder letter will be sent to the patient
regarding the next appointment.

BI-RADS CATEGORY  4: Suspicious.

## 2021-11-06 ENCOUNTER — Encounter: Payer: Self-pay | Admitting: Medical Oncology

## 2021-11-06 ENCOUNTER — Emergency Department
Admission: EM | Admit: 2021-11-06 | Discharge: 2021-11-06 | Disposition: A | Discharge status: Home / Self Care | Payer: No Typology Code available for payment source | Attending: Emergency Medicine | Admitting: Emergency Medicine

## 2021-11-06 ENCOUNTER — Other Ambulatory Visit: Payer: Self-pay

## 2021-11-06 DIAGNOSIS — R42 Dizziness and giddiness: Secondary | ICD-10-CM | POA: Diagnosis present

## 2021-11-06 DIAGNOSIS — Y92410 Unspecified street and highway as the place of occurrence of the external cause: Secondary | ICD-10-CM | POA: Insufficient documentation

## 2021-11-06 LAB — BASIC METABOLIC PANEL
Anion gap: 5 (ref 5–15)
BUN: 15 mg/dL (ref 6–20)
CO2: 24 mmol/L (ref 22–32)
Calcium: 8.6 mg/dL — ABNORMAL LOW (ref 8.9–10.3)
Chloride: 111 mmol/L (ref 98–111)
Creatinine, Ser: 1.01 mg/dL — ABNORMAL HIGH (ref 0.44–1.00)
GFR, Estimated: >60 mL/min (ref >60)
Glucose, Bld: 96 mg/dL (ref 70–99)
Potassium: 4.1 mmol/L (ref 3.5–5.1)
Sodium: 140 mmol/L (ref 135–145)

## 2021-11-06 LAB — CBC
HCT: 37.5 % (ref 36.0–46.0)
Hemoglobin: 12.3 g/dL (ref 12.0–15.0)
MCH: 30.8 pg (ref 26.0–34.0)
MCHC: 32.8 g/dL (ref 30.0–36.0)
MCV: 94 fL (ref 80.0–100.0)
Platelets: 246 10*3/uL (ref 150–400)
RBC: 3.99 MIL/uL (ref 3.87–5.11)
RDW: 12 % (ref 11.5–15.5)
WBC: 4.6 10*3/uL (ref 4.0–10.5)
nRBC: 0 % (ref 0.0–0.2)

## 2021-11-06 MED ORDER — DEXAMETHASONE 4 MG PO TABS
10.0000 mg | ORAL_TABLET | Freq: Once | ORAL | Status: DC
  Filled 2021-11-06: qty 3

## 2021-11-06 MED ORDER — DEXAMETHASONE 4 MG PO TABS: 10.0000 mg | ORAL_TABLET | Freq: Once | ORAL | 0 refills | Status: DC

## 2021-11-06 MED ORDER — MECLIZINE HCL 25 MG PO TABS: 25.0000 mg | ORAL_TABLET | Freq: Three times a day (TID) | ORAL | 0 refills | Status: AC | PRN

## 2021-11-06 MED ORDER — DEXAMETHASONE 4 MG PO TABS
10.0000 mg | ORAL_TABLET | Freq: Once | ORAL | Status: AC
  Administered 2021-11-06: 10 mg via ORAL

## 2021-11-06 MED ORDER — MECLIZINE HCL 25 MG PO TABS
25.0000 mg | ORAL_TABLET | Freq: Once | ORAL | Status: DC
  Filled 2021-11-06: qty 1

## 2021-11-06 MED ORDER — MECLIZINE HCL 25 MG PO TABS
25.0000 mg | ORAL_TABLET | Freq: Once | ORAL | Status: AC
  Administered 2021-11-06: 25 mg via ORAL

## 2021-11-06 MED ORDER — ONDANSETRON 4 MG PO TBDP: 4.0000 mg | ORAL_TABLET | Freq: Three times a day (TID) | ORAL | 0 refills | Status: AC | PRN

## 2021-11-06 NOTE — Discharge Instructions (Addendum)
For your dizziness:  Take the Meclizine three times daily for 1-2 days then as needed  Drink plenty of fluids  No driving until symptoms resolve  Take the Zofran for nausea  Drink at least 6-8 glasses of water daily  If symptoms do not resolve within 1 week, follow-up with your primary doctor
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
## 2022-01-24 ENCOUNTER — Other Ambulatory Visit: Payer: Self-pay

## 2022-01-24 DIAGNOSIS — Z1231 Encounter for screening mammogram for malignant neoplasm of breast: Secondary | ICD-10-CM

## 2022-01-29 ENCOUNTER — Other Ambulatory Visit: Payer: Self-pay

## 2022-01-29 ENCOUNTER — Ambulatory Visit: Payer: Self-pay | Attending: Radiation Oncology | Admitting: Hematology and Oncology

## 2022-01-29 ENCOUNTER — Ambulatory Visit
Admission: RE | Admit: 2022-01-29 | Discharge: 2022-01-29 | Disposition: A | Discharge status: Home / Self Care | Payer: Self-pay | Source: Ambulatory Visit | Attending: Obstetrics and Gynecology | Admitting: Obstetrics and Gynecology

## 2022-01-29 VITALS — BP 129/83 | Wt 131.9 lb

## 2022-01-29 DIAGNOSIS — Z1231 Encounter for screening mammogram for malignant neoplasm of breast: Secondary | ICD-10-CM | POA: Insufficient documentation

## 2022-01-29 DIAGNOSIS — Z01419 Encounter for gynecological examination (general) (routine) without abnormal findings: Secondary | ICD-10-CM

## 2022-01-29 NOTE — Progress Notes (Signed)
[name] is a 61 y.o. [id number] female who presents to [facility] clinic today with no complaints.    Pap Smear: Pap smear completed today. Last Pap smear was 2018 at [facility] and was normal. Per patient has no history of an abnormal Pap smear. Last Pap smear result is available in Epic. She reports having hysterectomy.   Physical exam: Breasts Breasts symmetrical. No skin abnormalities bilateral breasts. No nipple retraction bilateral breasts. No nipple discharge bilateral breasts. No lymphadenopathy. No lumps palpated bilateral breasts.     MS DIGITAL DIAG TOMO BILAT  Result Date: 12/15/2019 CLINICAL DATA:  RIGHT-sided mass worked up in 2020 that was favored to be mammographically stable since 2017. BI-RADS 3 follow-up to establish stability, missed six-month follow-up. High family history of breast cancer. EXAM: DIGITAL DIAGNOSTIC BILATERAL MAMMOGRAM WITH TOMO AND CAD; ULTRASOUND RIGHT BREAST LIMITED COMPARISON:  Previous exam(s). ACR Breast Density Category c: The breast tissue is heterogeneously dense, which may obscure small masses. FINDINGS: Tomosynthesis views demonstrate an unchanged mass in the RIGHT upper inner breast at 2 o'clock. No suspicious mass, distortion, or microcalcifications are identified to suggest presence of malignancy bilaterally. Mammographic images were processed with CAD. Targeted ultrasound performed of the RIGHT breast 2 o'clock 2 cm from the nipple. There is redemonstration of an oval hypoechoic mass with circumscribed margins which measures 5 x 3 x 5 mm, previously 5 x 3 x 6 mm. This is unchanged in comparison to prior when accounting for differences in measurement technique. IMPRESSION: Unchanged mammographically stable benign mass in the RIGHT breast at 2 o'clock. No mammographic evidence of malignancy bilaterally. RECOMMENDATION: Screening mammogram in one year.(Code:SM-B-01Y) The American Cancer Society recommends annual MRI and mammography in patients with  an estimated lifetime risk of developing breast cancer greater than 20 - 25%, or who are known or suspected to be positive for the breast cancer gene. I have discussed the findings and recommendations with the patient. If applicable, a reminder letter will be sent to the patient regarding the next appointment. BI-RADS CATEGORY  2: Benign. Electronically Signed   By: Valentino Saxon MD   On: 12/15/2019 10:00   MS DIGITAL DIAG TOMO UNI RIGHT  Addendum Date: 02/25/2019   ADDENDUM REPORT: 02/25/2019 14:05 ADDENDUM: Patient was brought back for ultrasound-guided core needle biopsy on 02/25/2019. The case was reviewed and compared with multiple prior exams. The small oval circumscribed mass within the right breast appears to be mammographically stable dating back to 2017. This was discussed with the patient. It was elected for the patient to return in 6 months for a follow-up right breast diagnostic mammogram and ultrasound to ensure stability of this mass which is felt to be probably benign. Recommendation: Follow-up right breast diagnostic mammogram and ultrasound in 6 months. BI-RADS category: 3: Probably benign. Electronically Signed   By: Lovey Newcomer M.D.   On: 02/25/2019 14:05   Result Date: 02/25/2019 CLINICAL DATA:  Patient was called back from screening mammogram for a possible asymmetry in the right breast. Strong family history of breast cancer. The patient's sister passed away from breast cancer at the age of 47. EXAM: DIGITAL DIAGNOSTIC RIGHT MAMMOGRAM WITH CAD AND TOMO ULTRASOUND RIGHT BREAST COMPARISON:  Previous exam(s). ACR Breast Density Category c: The breast tissue is heterogeneously dense, which may obscure small masses. FINDINGS: Additional imaging of the right breast was performed. There is persistence of a 9 mm mass in the upper inner quadrant of the breast. There are no malignant type microcalcifications.  Mammographic images were processed with CAD. On physical exam, I do not palpate a  mass in the upper-inner quadrant of the right breast. Targeted ultrasound is performed, showing there is a hypoechoic mass at 2 o'clock 2 cm from the nipple measuring 5 x 4 x 6 mm. Sonographic evaluation the right axilla does not show any enlarged adenopathy. IMPRESSION: Indeterminate mass in the 2 o'clock region of the right breast 3 cm from the nipple. RECOMMENDATION: Ultrasound-guided core biopsy of the mass in the 2 o'clock region of the right breast is recommended. I have discussed the findings and recommendations with the patient. If applicable, a reminder letter will be sent to the patient regarding the next appointment. BI-RADS CATEGORY  4: Suspicious. Electronically Signed: By: Lillia Mountain M.D. On: 01/13/2019 12:37   MS DIGITAL SCREENING TOMO BILATERAL  Result Date: 12/29/2018 CLINICAL DATA:  Screening. EXAM: DIGITAL SCREENING BILATERAL MAMMOGRAM WITH TOMO AND CAD COMPARISON:  Previous exam(s). ACR Breast Density Category c: The breast tissue is heterogeneously dense, which may obscure small masses. FINDINGS: In the right breast, a possible asymmetry warrants further evaluation. In the left breast, no findings suspicious for malignancy. Images were processed with CAD. IMPRESSION: Further evaluation is suggested for possible asymmetry in the right breast. RECOMMENDATION: Diagnostic mammogram and possibly ultrasound of the right breast. (Code:FI-R-18M) The patient will be contacted regarding the findings, and additional imaging will be scheduled. BI-RADS CATEGORY  0: Incomplete. Need additional imaging evaluation and/or prior mammograms for comparison. Electronically Signed   By: Dorise Bullion III M.D   On: 12/29/2018 14:19   MS DIGITAL DIAG TOMO BILAT  Result Date: 09/22/2017 CLINICAL DATA:  Patient describes intermittent shooting pain within the LEFT breast, 8-11 o'clock axes, for 2 months. EXAM: DIGITAL DIAGNOSTIC BILATERAL MAMMOGRAM WITH CAD AND TOMO COMPARISON:  Previous exam(s). ACR Breast  Density Category c: The breast tissue is heterogeneously dense, which may obscure small masses. FINDINGS: There are no dominant masses, suspicious calcifications or secondary signs of malignancy within either breast. There are stable postsurgical changes within the LEFT breast. Mammographic images were processed with CAD. IMPRESSION: No evidence of malignancy within either breast. RECOMMENDATION: 1.  Screening mammogram in one year.(Code:SM-B-01Y) 2. Benign causes of breast pain, and possible remedies, were discussed with the patient. Patient was encouraged to follow-up with referring physician if pain became localized and persistent or if a palpable lump/mass developed. I have discussed the findings and recommendations with the patient. Results were also provided in writing at the conclusion of the visit. If applicable, a reminder letter will be sent to the patient regarding the next appointment. BI-RADS CATEGORY  2: Benign. Electronically Signed   By: Franki Cabot M.D.   On: 09/22/2017 10:04      Pelvic/Bimanual Ext Genitalia No lesions, no swelling and no discharge observed on external genitalia.        Vagina Vagina pink and normal texture. No lesions or discharge observed in vagina.        Cervix Cervix is surgically absent   Uterus Uterus is surgically absent   Adnexae Bilateral ovaries present and palpable. No tenderness on palpation.         Rectovaginal No rectal exam completed today since patient had no rectal complaints. No skin abnormalities observed on exam.     Smoking History: Patient has never smoked and was not referred to quit line.    Patient Navigation: Patient education provided. Access to services provided for patient through [facility] program. No interpreter provided. No  transportation provided   Colorectal Cancer Screening: Per patient has never had colonoscopy completed No complaints today. FIT test given per [facility].   Breast and Cervical Cancer Risk  Assessment: Patient has family history of breast cancer, with her sister. Patient does not have history of cervical dysplasia, immunocompromised, or DES exposure in-utero.  Risk Scores as of 01/29/2022     Baker Janus           5-year 2.45 %   Lifetime 11.83 %            Last calculated by Demetrius Revel, LPN on 04/88/8916 at 10:40 AM        A: [facility] exam with pap smear No complaints with benign exam.   P: Referred patient to the [place]  for a screening mammogram. Appointment scheduled 01/29/22.  Dayton Scrape A, NP 01/29/2022 10:50 AM

## 2022-01-29 NOTE — Patient Instructions (Signed)
Taught Rowe Robert about self breast awareness. Patient did need a Pap smear today due to last Pap smear was in 2018 per patient. She reports having a partial hysterectomy and was still to receive Pap smears. Upon exam, her cervix is surgically absent. Vaginal pap done today. She will no longer need pap smears after this. [facility]. Appointment scheduled for 01/29/22. Patient aware of appointment and will be there. Let patient know will follow up with her within the next couple weeks with results. Rowe Robert verbalized understanding.  Melodye Ped, NP 11:06 AM

## 2022-02-01 LAB — CYTOLOGY - PAP
Comment: NEGATIVE
Diagnosis: NEGATIVE
High risk HPV: NEGATIVE

## 2022-02-07 ENCOUNTER — Telehealth: Payer: Self-pay

## 2022-02-07 NOTE — Telephone Encounter (Signed)
Patient informed negative Pap/HPV results, next pap due in 5 years, verbalized understanding.

## 2022-02-07 NOTE — Telephone Encounter (Signed)
Attempted to contact patient regarding pap results. Left message on voicemail requesting a return call.

## 2023-06-28 ENCOUNTER — Ambulatory Visit
Admission: EM | Admit: 2023-06-28 | Discharge: 2023-06-28 | Disposition: A | Discharge status: Home / Self Care | Attending: Family Medicine | Admitting: Family Medicine

## 2023-06-28 DIAGNOSIS — R21 Rash and other nonspecific skin eruption: Secondary | ICD-10-CM

## 2023-06-28 MED ORDER — FLUCONAZOLE 200 MG PO TABS: 200.0000 mg | ORAL_TABLET | ORAL | 0 refills | Status: AC

## 2023-06-28 MED ORDER — NYSTATIN-TRIAMCINOLONE 100000-0.1 UNIT/GM-% EX CREA: TOPICAL_CREAM | CUTANEOUS | 0 refills | Status: AC

## 2023-06-28 NOTE — ED Provider Notes (Signed)
 [facility]    CSN: [id number] Arrival date & time: 06/28/23  1158      History   Chief Complaint Chief Complaint  Patient presents with   Rash    HPI Heather Ayers is a 63 y.o. female.   HPI  Heather Ayers presents for rash intermittent for 4-5 months. It fades and come back.  She had a cream and pills but that didn't work.  The rash was itchy on her neck.     There is been no new products including soaps and detergents.  No eye irritation, sore throat, difficulty breathing, nausea, vomiting or diarrhea.  Denies belly pain, joint pain and fever.  There has been no medication changes or new supplements.  Denies any new foods or drinks.    Past Medical History:  Diagnosis Date   Fibroid    Vertigo     Patient Active Problem List   Diagnosis Date Noted   Mastalgia 01/11/2014    Past Surgical History:  Procedure Laterality Date   ABDOMINAL HYSTERECTOMY     BREAST EXCISIONAL BIOPSY Left 2008   Dr. Amalia Badder, nonproliferative breast tissue left UOQ.    BREAST EXCISIONAL BIOPSY Right 1980   neg   CHOLECYSTECTOMY      OB History     Gravida  1   Para  1   Term  1   Preterm      AB      Living  1      SAB      IAB      Ectopic      Multiple      Live Births  1        Obstetric Comments  1st Menstrual Cycle:  13 1st Pregnancy:  19          Home Medications    Prior to Admission medications   Medication Sig Start Date End Date Taking? Authorizing Provider  nystatin -triamcinolone  (MYCOLOG II) cream Apply to affected area daily 06/28/23  Yes Tanyon Alipio, DO  fluconazole  (DIFLUCAN ) 200 MG tablet Take 1 tablet (200 mg total) by mouth once a week for 6 doses. 06/28/23 08/03/23  Shateria Paternostro, DO  meclizine  (ANTIVERT ) 25 MG tablet Take 1 tablet (25 mg total) by mouth 3 (three) times daily as needed for dizziness. 11/06/21   Loman Risk, MD  ondansetron  (ZOFRAN -ODT) 4 MG disintegrating tablet Take 1 tablet (4 mg total) by mouth every 8  (eight) hours as needed for nausea or vomiting. 11/06/21   Loman Risk, MD    Family History Family History  Problem Relation Age of Onset   Breast cancer Sister 50   Diabetes Sister    Diabetes Brother    Ovarian cancer Neg Hx    Colon cancer Neg Hx     Social History Social History   Tobacco Use   Smoking status: Never   Smokeless tobacco: Never  Vaping Use   Vaping status: Never Used  Substance Use Topics   Alcohol use: Yes    Alcohol/week: 0.0 standard drinks of alcohol    Comment: rare   Drug use: No     Allergies   Codeine   Review of Systems Review of Systems :negative unless otherwise stated in HPI.      Physical Exam Triage Vital Signs ED Triage Vitals  Encounter Vitals Group     BP      Systolic BP Percentile      Diastolic BP Percentile  Pulse      Resp      Temp      Temp src      SpO2      Weight      Height      Head Circumference      Peak Flow      Pain Score      Pain Loc      Pain Education      Exclude from Growth Chart    No data found.  Updated Vital Signs BP 116/72 (BP Location: Left Arm)   Pulse (!) 58   Temp 98.9 F (37.2 C) (Oral)   Resp 16   Ht 5\' 5"  (1.651 m)   Wt 59 kg   SpO2 99%   BMI 21.63 kg/m   Visual Acuity Right Eye Distance:   Left Eye Distance:   Bilateral Distance:    Right Eye Near:   Left Eye Near:    Bilateral Near:     Physical Exam  GEN: alert, well appearing female, in no acute distress  EYES: no scleral injection or discharge RESP: no increased work of breathing NEURO: alert, moves all extremities appropriately SKIN: warm and dry; multiple irregular hyperpigmented macules and patches her hands and forearms with darkening of her of the skin of her fingers    UC Treatments / Results  Labs (all labs ordered are listed, but only abnormal results are displayed) Labs Reviewed - No data to display  EKG   Radiology No results found.  Procedures Procedures (including  critical care time)  Medications Ordered in UC Medications - No data to display  Initial Impression / Assessment and Plan / UC Course  I have reviewed the triage vital signs and the nursing notes.  Pertinent labs & imaging results that were available during my care of the patient were reviewed by me and considered in my medical decision making (see chart for details).     Patient is a 63 y.o. femalewho presents for rash.  Overall, patient is well-appearing and well-hydrated.  Vital signs stable.  Heather Ayers is afebrile.  Exam concerning for tinea versicolor.  Treat with Diflucan  weekly for 6 weeks and antifungal with steroid ointment. No sign of infection to suggest antibiotics at this time.  Not likely viral exanthem.  Given the chronicity of her rash recommended patient be followed up with her PCP for a dermatology referral.  Reviewed expectations regarding course of current medical issues.  All questions asked were answered.  Outlined signs and symptoms indicating need for more acute intervention. Patient verbalized understanding. After Visit Summary given.   Final Clinical Impressions(s) / UC Diagnoses   Final diagnoses:  Rash     Discharge Instructions      Stop by the pharmacy to pick up your prescriptions.  Follow up with your primary care provider or dermatologist, if not improving.        ED Prescriptions     Medication Sig Dispense Auth. Provider   fluconazole  (DIFLUCAN ) 200 MG tablet Take 1 tablet (200 mg total) by mouth once a week for 6 doses. 6 tablet Nakeisha Greenhouse, DO   nystatin -triamcinolone  (MYCOLOG II) cream Apply to affected area daily 15 g Aydia Maj, DO      PDMP not reviewed this encounter.              Tilly Pernice, DO 06/28/23 1311

## 2023-06-28 NOTE — Discharge Instructions (Signed)
 Stop by the pharmacy to pick up your prescriptions.  Follow up with your primary care provider or dermatologist, if not improving.

## 2023-06-28 NOTE — ED Triage Notes (Addendum)
 Patient presents with "rash" on neck and bilateral wrist and hands. Pt states rash come and go x 4/5 months. Pt was treated with "pills and cream and it did not work." Pt describes as itching when it first started.   Patient states she is having floaters x 4/5 months that come and go. Pt states nothing was shown on lab work.

## 2023-07-28 DIAGNOSIS — L538 Other specified erythematous conditions: Secondary | ICD-10-CM | POA: Diagnosis not present

## 2023-07-28 DIAGNOSIS — L648 Other androgenic alopecia: Secondary | ICD-10-CM | POA: Diagnosis not present
